# Patient Record
Sex: Male | Born: 1940 | Race: White | Hispanic: No | Marital: Married | Smoking: Former smoker
Health system: Southern US, Community
[De-identification: ages and names within clinical notes are randomized; demographics above are authoritative.]

## PROBLEM LIST (undated history)

## (undated) DIAGNOSIS — Z789 Other specified health status: Secondary | ICD-10-CM

## (undated) DIAGNOSIS — G5603 Carpal tunnel syndrome, bilateral upper limbs: Secondary | ICD-10-CM

## (undated) DIAGNOSIS — M8580 Other specified disorders of bone density and structure, unspecified site: Secondary | ICD-10-CM

## (undated) DIAGNOSIS — M4800 Spinal stenosis, site unspecified: Secondary | ICD-10-CM

## (undated) DIAGNOSIS — R634 Abnormal weight loss: Secondary | ICD-10-CM

## (undated) DIAGNOSIS — S42309A Unspecified fracture of shaft of humerus, unspecified arm, initial encounter for closed fracture: Secondary | ICD-10-CM

## (undated) DIAGNOSIS — B009 Herpesviral infection, unspecified: Secondary | ICD-10-CM

## (undated) DIAGNOSIS — R350 Frequency of micturition: Secondary | ICD-10-CM

## (undated) DIAGNOSIS — F40240 Claustrophobia: Secondary | ICD-10-CM

## (undated) DIAGNOSIS — N400 Enlarged prostate without lower urinary tract symptoms: Secondary | ICD-10-CM

## (undated) DIAGNOSIS — G5760 Lesion of plantar nerve, unspecified lower limb: Secondary | ICD-10-CM

## (undated) DIAGNOSIS — N3281 Overactive bladder: Secondary | ICD-10-CM

## (undated) DIAGNOSIS — R1032 Left lower quadrant pain: Secondary | ICD-10-CM

## (undated) DIAGNOSIS — E785 Hyperlipidemia, unspecified: Secondary | ICD-10-CM

## (undated) DIAGNOSIS — Z973 Presence of spectacles and contact lenses: Secondary | ICD-10-CM

## (undated) HISTORY — PX: CARPAL TUNNEL RELEASE: SHX101

## (undated) HISTORY — DX: Spinal stenosis, site unspecified: M48.00

## (undated) HISTORY — DX: Unspecified fracture of shaft of humerus, unspecified arm, initial encounter for closed fracture: S42.309A

## (undated) HISTORY — PX: TONSILLECTOMY: SUR1361

## (undated) HISTORY — DX: Left lower quadrant pain: R10.32

## (undated) HISTORY — DX: Carpal tunnel syndrome, bilateral upper limbs: G56.03

## (undated) HISTORY — DX: Other specified disorders of bone density and structure, unspecified site: M85.80

## (undated) HISTORY — DX: Overactive bladder: N32.81

## (undated) HISTORY — DX: Lesion of plantar nerve, unspecified lower limb: G57.60

## (undated) HISTORY — DX: Abnormal weight loss: R63.4

## (undated) HISTORY — DX: Herpesviral infection, unspecified: B00.9

## (undated) HISTORY — PX: COLONOSCOPY: SHX174

## (undated) HISTORY — PX: LUMBAR EPIDURAL INJECTION: SHX1980

## (undated) HISTORY — PX: HERNIA REPAIR: SHX51

## (undated) HISTORY — DX: Claustrophobia: F40.240

## (undated) HISTORY — PX: FOOT NEUROMA SURGERY: SHX646

---

## 2002-01-01 ENCOUNTER — Encounter: Admission: RE | Admit: 2002-01-01 | Discharge: 2002-01-01 | Payer: Self-pay | Admitting: Gastroenterology

## 2002-01-01 ENCOUNTER — Encounter: Payer: Self-pay | Admitting: Gastroenterology

## 2002-04-17 ENCOUNTER — Encounter: Admission: RE | Admit: 2002-04-17 | Discharge: 2002-04-17 | Payer: Self-pay | Admitting: Orthopaedic Surgery

## 2002-04-17 ENCOUNTER — Encounter: Payer: Self-pay | Admitting: Orthopaedic Surgery

## 2002-05-10 ENCOUNTER — Encounter: Payer: Self-pay | Admitting: Orthopaedic Surgery

## 2002-05-10 ENCOUNTER — Encounter: Admission: RE | Admit: 2002-05-10 | Discharge: 2002-05-10 | Payer: Self-pay | Admitting: Orthopaedic Surgery

## 2002-05-25 ENCOUNTER — Encounter: Admission: RE | Admit: 2002-05-25 | Discharge: 2002-05-25 | Payer: Self-pay | Admitting: Orthopaedic Surgery

## 2002-05-25 ENCOUNTER — Encounter: Payer: Self-pay | Admitting: Orthopaedic Surgery

## 2002-06-09 ENCOUNTER — Encounter: Admission: RE | Admit: 2002-06-09 | Discharge: 2002-06-09 | Payer: Self-pay | Admitting: Orthopaedic Surgery

## 2002-06-09 ENCOUNTER — Encounter: Payer: Self-pay | Admitting: Orthopaedic Surgery

## 2004-03-17 ENCOUNTER — Encounter: Admission: RE | Admit: 2004-03-17 | Discharge: 2004-03-17 | Payer: Self-pay | Admitting: Orthopaedic Surgery

## 2004-04-24 ENCOUNTER — Encounter: Admission: RE | Admit: 2004-04-24 | Discharge: 2004-04-24 | Payer: Self-pay | Admitting: Orthopaedic Surgery

## 2004-05-14 ENCOUNTER — Encounter: Admission: RE | Admit: 2004-05-14 | Discharge: 2004-05-14 | Payer: Self-pay | Admitting: Orthopaedic Surgery

## 2007-07-13 ENCOUNTER — Encounter: Admission: RE | Admit: 2007-07-13 | Discharge: 2007-07-13 | Payer: Self-pay | Admitting: Orthopaedic Surgery

## 2008-08-30 ENCOUNTER — Encounter: Admission: RE | Admit: 2008-08-30 | Discharge: 2008-08-30 | Payer: Self-pay | Admitting: Orthopedic Surgery

## 2008-12-02 ENCOUNTER — Encounter: Admission: RE | Admit: 2008-12-02 | Discharge: 2008-12-02 | Payer: Self-pay | Admitting: Orthopaedic Surgery

## 2010-03-30 ENCOUNTER — Ambulatory Visit (HOSPITAL_COMMUNITY)
Admission: RE | Admit: 2010-03-30 | Discharge: 2010-03-30 | Payer: Self-pay | Source: Home / Self Care | Attending: Internal Medicine | Admitting: Internal Medicine

## 2010-10-04 ENCOUNTER — Other Ambulatory Visit: Payer: Self-pay | Admitting: Internal Medicine

## 2010-10-04 DIAGNOSIS — J984 Other disorders of lung: Secondary | ICD-10-CM

## 2010-10-09 ENCOUNTER — Ambulatory Visit
Admission: RE | Admit: 2010-10-09 | Discharge: 2010-10-09 | Disposition: A | Discharge status: Home / Self Care | Payer: Medicare Other | Source: Ambulatory Visit | Attending: Internal Medicine | Admitting: Internal Medicine

## 2010-10-09 DIAGNOSIS — J984 Other disorders of lung: Secondary | ICD-10-CM

## 2010-10-09 MED ORDER — IOHEXOL 300 MG/ML  SOLN
75.0000 mL | Freq: Once | INTRAMUSCULAR | Status: AC | PRN
  Administered 2010-10-09: 75 mL via INTRAVENOUS

## 2011-04-02 ENCOUNTER — Other Ambulatory Visit: Payer: Self-pay | Admitting: Internal Medicine

## 2011-04-02 ENCOUNTER — Other Ambulatory Visit: Payer: Self-pay | Admitting: *Deleted

## 2011-04-02 DIAGNOSIS — R911 Solitary pulmonary nodule: Secondary | ICD-10-CM

## 2011-04-05 ENCOUNTER — Ambulatory Visit
Admission: RE | Admit: 2011-04-05 | Discharge: 2011-04-05 | Disposition: A | Discharge status: Home / Self Care | Payer: Medicare Other | Source: Ambulatory Visit | Attending: Internal Medicine | Admitting: Internal Medicine

## 2011-04-05 DIAGNOSIS — R911 Solitary pulmonary nodule: Secondary | ICD-10-CM

## 2011-04-05 MED ORDER — IOHEXOL 300 MG/ML  SOLN
75.0000 mL | Freq: Once | INTRAMUSCULAR | Status: AC | PRN
  Administered 2011-04-05: 75 mL via INTRAVENOUS

## 2011-07-11 ENCOUNTER — Other Ambulatory Visit: Payer: Self-pay | Admitting: Orthopedic Surgery

## 2011-07-17 ENCOUNTER — Encounter (HOSPITAL_BASED_OUTPATIENT_CLINIC_OR_DEPARTMENT_OTHER): Payer: Self-pay | Admitting: *Deleted

## 2011-07-17 NOTE — Progress Notes (Signed)
No labs needed Told him wedding ring would have to be cut off if could not get off.-lt ctr

## 2011-07-23 ENCOUNTER — Encounter (HOSPITAL_BASED_OUTPATIENT_CLINIC_OR_DEPARTMENT_OTHER): Payer: Self-pay

## 2011-07-23 ENCOUNTER — Encounter (HOSPITAL_BASED_OUTPATIENT_CLINIC_OR_DEPARTMENT_OTHER): Payer: Self-pay | Admitting: Certified Registered Nurse Anesthetist

## 2011-07-23 ENCOUNTER — Encounter (HOSPITAL_BASED_OUTPATIENT_CLINIC_OR_DEPARTMENT_OTHER): Payer: Self-pay | Admitting: Orthopedic Surgery

## 2011-07-23 ENCOUNTER — Ambulatory Visit (HOSPITAL_BASED_OUTPATIENT_CLINIC_OR_DEPARTMENT_OTHER): Payer: Medicare Other | Admitting: Certified Registered Nurse Anesthetist

## 2011-07-23 ENCOUNTER — Ambulatory Visit (HOSPITAL_BASED_OUTPATIENT_CLINIC_OR_DEPARTMENT_OTHER)
Admission: RE | Admit: 2011-07-23 | Discharge: 2011-07-23 | Disposition: A | Discharge status: Home / Self Care | Payer: Medicare Other | Source: Ambulatory Visit | Attending: Orthopedic Surgery | Admitting: Orthopedic Surgery

## 2011-07-23 ENCOUNTER — Encounter (HOSPITAL_BASED_OUTPATIENT_CLINIC_OR_DEPARTMENT_OTHER)
Admission: RE | Disposition: A | Discharge status: Home / Self Care | Payer: Self-pay | Source: Ambulatory Visit | Attending: Orthopedic Surgery

## 2011-07-23 DIAGNOSIS — G5600 Carpal tunnel syndrome, unspecified upper limb: Secondary | ICD-10-CM | POA: Insufficient documentation

## 2011-07-23 HISTORY — DX: Benign prostatic hyperplasia without lower urinary tract symptoms: N40.0

## 2011-07-23 HISTORY — DX: Other specified health status: Z78.9

## 2011-07-23 HISTORY — DX: Frequency of micturition: R35.0

## 2011-07-23 HISTORY — DX: Hyperlipidemia, unspecified: E78.5

## 2011-07-23 SURGERY — CARPAL TUNNEL RELEASE
Anesthesia: Monitor Anesthesia Care | Site: Hand | Laterality: Left | Wound class: Clean

## 2011-07-23 MED ORDER — PROPOFOL 10 MG/ML IV EMUL
INTRAVENOUS | Status: DC | PRN
  Administered 2011-07-23: 75 ug/kg/min via INTRAVENOUS

## 2011-07-23 MED ORDER — LIDOCAINE HCL (PF) 0.5 % IJ SOLN
INTRAMUSCULAR | Status: DC | PRN
  Administered 2011-07-23: 30 mL via INTRATHECAL

## 2011-07-23 MED ORDER — CEFAZOLIN SODIUM 1-5 GM-% IV SOLN
1.0000 g | INTRAVENOUS | Status: AC
  Administered 2011-07-23: 2 g via INTRAVENOUS

## 2011-07-23 MED ORDER — CHLORHEXIDINE GLUCONATE 4 % EX LIQD: 60.0000 mL | Freq: Once | CUTANEOUS | Status: DC

## 2011-07-23 MED ORDER — FENTANYL CITRATE 0.05 MG/ML IJ SOLN: 25.0000 ug | INTRAMUSCULAR | Status: DC | PRN

## 2011-07-23 MED ORDER — MIDAZOLAM HCL 2 MG/2ML IJ SOLN: 0.5000 mg | INTRAMUSCULAR | Status: DC | PRN

## 2011-07-23 MED ORDER — FENTANYL CITRATE 0.05 MG/ML IJ SOLN
INTRAMUSCULAR | Status: DC | PRN
  Administered 2011-07-23: 50 ug via INTRAVENOUS

## 2011-07-23 MED ORDER — MORPHINE SULFATE 2 MG/ML IJ SOLN: 0.0500 mg/kg | INTRAMUSCULAR | Status: DC | PRN

## 2011-07-23 MED ORDER — FENTANYL CITRATE 0.05 MG/ML IJ SOLN: 50.0000 ug | INTRAMUSCULAR | Status: DC | PRN

## 2011-07-23 MED ORDER — DEXAMETHASONE SODIUM PHOSPHATE 10 MG/ML IJ SOLN
INTRAMUSCULAR | Status: DC | PRN
  Administered 2011-07-23: 4 mg via INTRAVENOUS

## 2011-07-23 MED ORDER — LACTATED RINGERS IV SOLN
INTRAVENOUS | Status: DC
  Administered 2011-07-23 (×3): via INTRAVENOUS

## 2011-07-23 MED ORDER — LIDOCAINE HCL (CARDIAC) 20 MG/ML IV SOLN
INTRAVENOUS | Status: DC | PRN
  Administered 2011-07-23: 20 mg via INTRAVENOUS

## 2011-07-23 MED ORDER — ONDANSETRON HCL 4 MG/2ML IJ SOLN
INTRAMUSCULAR | Status: DC | PRN
  Administered 2011-07-23: 4 mg via INTRAVENOUS

## 2011-07-23 MED ORDER — BUPIVACAINE HCL (PF) 0.25 % IJ SOLN
INTRAMUSCULAR | Status: DC | PRN
  Administered 2011-07-23: 7 mL

## 2011-07-23 MED ORDER — METOCLOPRAMIDE HCL 5 MG/ML IJ SOLN: 10.0000 mg | Freq: Once | INTRAMUSCULAR | Status: DC | PRN

## 2011-07-23 SURGICAL SUPPLY — 35 items
BANDAGE GAUZE ELAST BULKY 4 IN (GAUZE/BANDAGES/DRESSINGS) ×2 IMPLANT
BLADE SURG 15 STRL LF DISP TIS (BLADE) ×1 IMPLANT
BLADE SURG 15 STRL SS (BLADE) ×2
BNDG CMPR 9X4 STRL LF SNTH (GAUZE/BANDAGES/DRESSINGS)
BNDG COHESIVE 3X5 TAN STRL LF (GAUZE/BANDAGES/DRESSINGS) ×2 IMPLANT
BNDG ESMARK 4X9 LF (GAUZE/BANDAGES/DRESSINGS) IMPLANT
CHLORAPREP W/TINT 26ML (MISCELLANEOUS) ×2 IMPLANT
CLOTH BEACON ORANGE TIMEOUT ST (SAFETY) ×1 IMPLANT
CORDS BIPOLAR (ELECTRODE) ×2 IMPLANT
COVER MAYO STAND STRL (DRAPES) ×2 IMPLANT
COVER TABLE BACK 60X90 (DRAPES) ×2 IMPLANT
CUFF TOURNIQUET SINGLE 18IN (TOURNIQUET CUFF) ×2 IMPLANT
DRAPE EXTREMITY T 121X128X90 (DRAPE) ×2 IMPLANT
DRAPE SURG 17X23 STRL (DRAPES) ×2 IMPLANT
DRSG KUZMA FLUFF (GAUZE/BANDAGES/DRESSINGS) ×2 IMPLANT
GAUZE XEROFORM 1X8 LF (GAUZE/BANDAGES/DRESSINGS) ×2 IMPLANT
GLOVE BIO SURGEON STRL SZ 6.5 (GLOVE) ×2 IMPLANT
GLOVE SURG ORTHO 8.0 STRL STRW (GLOVE) ×2 IMPLANT
GOWN BRE IMP PREV XXLGXLNG (GOWN DISPOSABLE) ×2 IMPLANT
GOWN PREVENTION PLUS XLARGE (GOWN DISPOSABLE) ×2 IMPLANT
NEEDLE 27GAX1X1/2 (NEEDLE) ×1 IMPLANT
NS IRRIG 1000ML POUR BTL (IV SOLUTION) ×2 IMPLANT
PACK BASIN DAY SURGERY FS (CUSTOM PROCEDURE TRAY) ×2 IMPLANT
PAD CAST 3X4 CTTN HI CHSV (CAST SUPPLIES) ×1 IMPLANT
PADDING CAST ABS 4INX4YD NS (CAST SUPPLIES)
PADDING CAST ABS COTTON 4X4 ST (CAST SUPPLIES) ×1 IMPLANT
PADDING CAST COTTON 3X4 STRL (CAST SUPPLIES) ×2
SPONGE GAUZE 4X4 12PLY (GAUZE/BANDAGES/DRESSINGS) ×2 IMPLANT
STOCKINETTE 4X48 STRL (DRAPES) ×2 IMPLANT
SUT VICRYL 4-0 PS2 18IN ABS (SUTURE) IMPLANT
SUT VICRYL RAPIDE 4/0 PS 2 (SUTURE) ×2 IMPLANT
SYR BULB 3OZ (MISCELLANEOUS) ×2 IMPLANT
SYR CONTROL 10ML LL (SYRINGE) ×1 IMPLANT
TOWEL OR 17X24 6PK STRL BLUE (TOWEL DISPOSABLE) ×2 IMPLANT
UNDERPAD 30X30 INCONTINENT (UNDERPADS AND DIAPERS) ×2 IMPLANT

## 2011-07-23 NOTE — Progress Notes (Signed)
Dr. Cipriano Mile in to see.

## 2011-07-23 NOTE — Op Note (Signed)
NAME:  OLGA, SEYLER NO.:  [id number]  MEDICAL RECORD NO.:  [id number]  LOCATION:                                 FACILITY:  PHYSICIAN:  Cindee Salt, M.D.            DATE OF BIRTH:  DATE OF PROCEDURE:  07/23/2011 DATE OF DISCHARGE:                              OPERATIVE REPORT   PREOPERATIVE DIAGNOSIS:  Carpal tunnel syndrome, left hand.  POSTOPERATIVE DIAGNOSIS:  Carpal tunnel syndrome, left hand.  OPERATION:  Decompression of median nerve.  SURGEON:  Cindee Salt, M.D.  ASSISTANT:  None.  ANESTHESIA:  Forearm-based IV regional with local infiltration.  ANESTHESIOLOGIST:  Janetta Hora. Gelene Mink, M.D.  HISTORY:  The patient is a 71 year old male with a history of carpal tunnel syndrome.  Nerve conductions positive, not responsive to conservative treatment.  He has elected to undergo surgical decompression.  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  Pre, peri, and postoperative course have been discussed along with risks and complications.  He is aware that there is no guarantee with the surgery, possibility of infection, recurrence injury to arteries, nerves, tendons, incomplete relief of symptoms, and dystrophy.  In the preoperative area, the patient is seen, the extremity marked by both the patient and surgeon, and antibiotic given.  PROCEDURE:  The patient was brought to the operating room where a forearm-based IV regional anesthetic was carried out without difficulty. He was prepped using ChloraPrep, supine position, right arm free.  A 3- minute dry time was allowed.  Time-out taken, confirming the patient and procedure.  A longitudinal incision was made in the palm, carried down through subcutaneous tissue.  Bleeders were electrocauterized with bipolar.  Palmar fascia was split.  Superficial palmar arch identified. The flexor tendon of the ring little finger identified,  retractors placed.  To the ulnar side of median nerve, carpal retinaculum was incised with sharp dissection.  Right angle and Sewall retractor were placed between skin and forearm fascia.  The fascia released for approximately a 1.5 cm proximal to the wrist crease under direct vision. Canal was explored.  Air compression to the nerve was apparent.  No further lesions were identified.  The wound was irrigated.  Skin was closed with interrupted 4-0 Vicryl Rapide sutures. Local infiltration with 0.25% Marcaine without epinephrine was given, approximately 8 mL was used.  A sterile compressive dressing with fingers free was applied.  On deflation of the tourniquet, all fingers immediately pinked.  He was taken to the recovery room for observation in satisfactory condition.          ______________________________ Cindee Salt, M.D.     GK/MEDQ  D:  07/23/2011  T:  07/23/2011  Job:  [id number]

## 2011-07-23 NOTE — H&P (Signed)
William Swanson is a 71 year-old right-hand dominant male complaining of numbness and tingling, left hand or his forearm especially in the evening.  This has been going on for approximately four weeks.  He has occasional symptoms on his right side.  He states that driving will frequently cause a problem for him.  He awakes and shakes his hand. This will frequently help settle symptoms down for him.  He was placed in a wrist splint which made matters worse for him.  He has no history of injury to the hand or to the neck.  No history of diabetes, thyroid problems, arthritis or gout.  He has been taking Aleve two twice a day with meals and precautions giving him minimal relief.  He complains of an intermittent, dull aching type pain with the numbness all fingers.  This has not significantly changed.  NCV are positive for CTS.  ALLERGIES:   None. MEDICATIONS:   Istalol, Lipitor and Uroxatral. SURGICAL HISTORY:   Tonsillectomy FAMILY MEDICAL HISTORY:   Negative. SOCIAL HISTORY:    He does not smoke, he drinks socially.  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 REVIEW OF SYSTEMS:    Positive for glasses, otherwise negative 14 points.  William Swanson is an 71 y.o. male.   Chief Complaint: CTS lt HPI: see above  Past Medical History  Diagnosis Date  . BPH (benign prostatic hyperplasia)     mild  . Frequency of urination   . Hyperlipemia   . No pertinent past medical history     Past Surgical History  Procedure Date  . Tonsillectomy   . Colonoscopy   . Foot neuroma surgery     lt foot  . Lumbar epidural injection     History reviewed. No pertinent family history. Social History:  reports that he quit smoking about 36 years ago. He does not have any smokeless tobacco history on file. He reports that he drinks alcohol. He reports that he does not use illicit drugs.  Allergies: No Known Allergies  No current facility-administered medications on file as of .   Medications Prior to Admission    Medication Sig Dispense Refill  . alfuzosin (UROXATRAL) 10 MG 24 hr tablet Take 10 mg by mouth daily.      Marland Kitchen aspirin 81 MG tablet Take 81 mg by mouth daily.      Marland Kitchen atorvastatin (LIPITOR) 20 MG tablet Take 20 mg by mouth daily.      . calcium citrate (CALCITRATE - DOSED IN MG ELEMENTAL CALCIUM) 950 MG tablet Take 1 tablet by mouth daily.      . cholecalciferol (VITAMIN D) 1000 UNITS tablet Take 1,000 Units by mouth daily.      . fish oil-omega-3 fatty acids 1000 MG capsule Take 2 g by mouth daily.      . vitamin C (ASCORBIC ACID) 500 MG tablet Take 500 mg by mouth daily.        No results found for this or any previous visit (from the past 48 hour(s)).  No results found.   Pertinent items are noted in HPI.  Height 5\' 9"  (1.753 m), weight 77.111 kg (170 lb).  General appearance: alert, cooperative and appears stated age Head: Normocephalic, without obvious abnormality Neck: no adenopathy Resp: clear to auscultation bilaterally Cardio: regular rate and rhythm, S1, S2 normal, no murmur, click, rub or gallop GI: soft, non-tender; bowel sounds normal; no masses,  no organomegaly Extremities: extremities normal, atraumatic, no cyanosis or edema  Pulses: 2+ and symmetric Skin: Skin color, texture, turgor normal. No rashes or lesions Neurologic: Grossly normal Incision/Wound: na  Assessment/Plan He would like to have the carpal tunnels treated. He states he has been nursing this for a year and it has not disappeared. The pre, peri and post op course are discussed along with risks and complications. He is aware there is no guarantee with surgery, possibility of infection, recurrence, injury to arteries, nerves and tendons, incomplete relief of symptoms and dystrophy.   William Swanson R 07/23/2011, 5:38 AM

## 2011-07-23 NOTE — Discharge Instructions (Addendum)

## 2011-07-23 NOTE — Progress Notes (Signed)
Pulse 38-45. bp stable.  Denies s&s.  Dr. Gelene Mink in formed.  No new orders rexceived.  Will check vs in phase 2.

## 2011-07-23 NOTE — Anesthesia Procedure Notes (Signed)
Procedure Name: MAC Date/Time: 07/23/2011 8:41 AM Performed by: Mayla Biddy D Pre-anesthesia Checklist: Patient identified, Emergency Drugs available, Suction available, Patient being monitored and Timeout performed Patient Re-evaluated:Patient Re-evaluated prior to inductionOxygen Delivery Method: Simple face mask

## 2011-07-23 NOTE — Transfer of Care (Signed)
Immediate Anesthesia Transfer of Care Note  Patient: William Swanson  Procedure(s) Performed: Procedure(s) (LRB): CARPAL TUNNEL RELEASE (Left)  Patient Location: PACU  Anesthesia Type: Bier block  Level of Consciousness: awake, alert , oriented and patient cooperative  Airway & Oxygen Therapy: Patient Spontanous Breathing and Patient connected to face mask oxygen  Post-op Assessment: Report given to PACU RN and Post -op Vital signs reviewed and stable  Post vital signs: Reviewed and stable  Complications: No apparent anesthesia complications

## 2011-07-23 NOTE — Anesthesia Postprocedure Evaluation (Signed)
Anesthesia Post Note  Patient: William Swanson  Procedure(s) Performed: Procedure(s) (LRB): CARPAL TUNNEL RELEASE (Left)  Anesthesia type: MAC  Patient location: PACU  Post pain: Pain level controlled  Post assessment: Patient's Cardiovascular Status Stable  Last Vitals:  Filed Vitals:   07/23/11 0945  BP: 103/64  Pulse:   Temp:   Resp:     Post vital signs: Reviewed and stable  Level of consciousness: alert  Complications: No apparent anesthesia complications

## 2011-07-23 NOTE — Op Note (Signed)
Dictated number: [id number]

## 2011-07-23 NOTE — Brief Op Note (Signed)
07/23/2011  9:12 AM  PATIENT:  William Swanson  71 y.o. male  PRE-OPERATIVE DIAGNOSIS:  left carpal tunnel syndrome  POST-OPERATIVE DIAGNOSIS:  left carpal tunnel syndrome  PROCEDURE:  Procedure(s) (LRB): CARPAL TUNNEL RELEASE (Left)  SURGEON:  Surgeon(s) and Role:    * Nicki Reaper, MD - Primary  PHYSICIAN ASSISTANT:   ASSISTANTS: none   ANESTHESIA:   local and regional  EBL:  Total I/O In: 800 [I.V.:800] Out: -   BLOOD ADMINISTERED:none  DRAINS: none   LOCAL MEDICATIONS USED:  MARCAINE     SPECIMEN:  No Specimen  DISPOSITION OF SPECIMEN:  N/A  COUNTS:  YES  TOURNIQUET:   Total Tourniquet Time Documented: Forearm (Left) - 23 minutes  DICTATION: .Other Dictation: Dictation Number [id number]  PLAN OF CARE: Discharge to home after PACU  PATIENT DISPOSITION:  PACU - hemodynamically stable.

## 2011-07-23 NOTE — Anesthesia Preprocedure Evaluation (Signed)
Anesthesia Evaluation  Patient identified by MRN, date of birth, ID band Patient awake    Reviewed: Allergy & Precautions, H&P , NPO status , Patient's Chart, lab work & pertinent test results, reviewed documented beta blocker date and time   Airway Mallampati: II TM Distance: >3 FB Neck ROM: full    Dental   Pulmonary neg pulmonary ROS,          Cardiovascular negative cardio ROS      Neuro/Psych negative neurological ROS  negative psych ROS   GI/Hepatic negative GI ROS, Neg liver ROS,   Endo/Other  negative endocrine ROS  Renal/GU negative Renal ROS  negative genitourinary   Musculoskeletal   Abdominal   Peds  Hematology negative hematology ROS (+)   Anesthesia Other Findings See surgeon's H&P   Reproductive/Obstetrics negative OB ROS                           Anesthesia Physical Anesthesia Plan  ASA: II  Anesthesia Plan: MAC and Bier Block   Post-op Pain Management:    Induction: Intravenous  Airway Management Planned: Natural Airway  Additional Equipment:   Intra-op Plan:   Post-operative Plan: Extubation in OR  Informed Consent: I have reviewed the patients History and Physical, chart, labs and discussed the procedure including the risks, benefits and alternatives for the proposed anesthesia with the patient or authorized representative who has indicated his/her understanding and acceptance.     Plan Discussed with: CRNA and Surgeon  Anesthesia Plan Comments:         Anesthesia Quick Evaluation

## 2011-07-25 ENCOUNTER — Encounter (HOSPITAL_BASED_OUTPATIENT_CLINIC_OR_DEPARTMENT_OTHER): Payer: Self-pay | Admitting: Orthopedic Surgery

## 2011-08-22 ENCOUNTER — Encounter: Payer: Self-pay | Admitting: Cardiology

## 2011-08-22 ENCOUNTER — Ambulatory Visit (INDEPENDENT_AMBULATORY_CARE_PROVIDER_SITE_OTHER): Payer: Medicare Other | Admitting: Cardiology

## 2011-08-22 VITALS — BP 130/76 | HR 87 | Resp 18 | Ht 69.0 in | Wt 182.8 lb

## 2011-08-22 DIAGNOSIS — E7800 Pure hypercholesterolemia, unspecified: Secondary | ICD-10-CM

## 2011-08-22 DIAGNOSIS — R42 Dizziness and giddiness: Secondary | ICD-10-CM

## 2011-08-22 DIAGNOSIS — R9431 Abnormal electrocardiogram [ECG] [EKG]: Secondary | ICD-10-CM

## 2011-08-22 NOTE — Patient Instructions (Signed)
Your physician has requested that you have an echocardiogram in 1 WEEK. Echocardiography is a painless test that uses sound waves to create images of your heart. It provides your doctor with information about the size and shape of your heart and how well your heart's chambers and valves are working. This procedure takes approximately one hour. There are no restrictions for this procedure.  Your physician has requested that you have an exercise tolerance test in 1 WEEK. For further information please visit [email or web address] Please also follow instruction sheet, as given.  Your physician recommends that you continue on your current medications as directed. Please refer to the Current Medication list given to you today.

## 2011-08-23 ENCOUNTER — Encounter: Payer: Self-pay | Admitting: Cardiology

## 2011-08-23 DIAGNOSIS — R9431 Abnormal electrocardiogram [ECG] [EKG]: Secondary | ICD-10-CM | POA: Insufficient documentation

## 2011-08-23 DIAGNOSIS — R42 Dizziness and giddiness: Secondary | ICD-10-CM | POA: Insufficient documentation

## 2011-08-23 DIAGNOSIS — E7800 Pure hypercholesterolemia, unspecified: Secondary | ICD-10-CM | POA: Insufficient documentation

## 2011-08-23 NOTE — Progress Notes (Signed)
HPI:  Mr.  William Swanson is seen at the courtesy of Dr. Eric Form.  I have known the patient for many years through a variety of connections.  We are asked to see him because of a persisting issue with intermittent lightheadedness.  I have some records from Dr. Clelia Croft, and have spoken with him about the patient.  Mr. Swanson underwent carpal tunnel surgery on April 2, and leading into that was fine.  Since then, he has had a lingering lightheadedness that is intermittent.  It is not necessarily positional and can occur at any time.  His initial visit at [facility] was with their nurse practitioner, and an orthostatic component was suggested as he has had some mild chronic positional lightheadedness.  He also had moderate resting bradycardia with a pulse in the forties.   However, this has persisted.  Dr. Clelia Croft appropriately suggested some time out from the surgery, but it has not necessarily gotten better with some time now.  As such, consideration for evaluation was suggested.  It can occur any time.  For example, he was great Monday and Tuesday of this week, but today had some of the same, and experiences some of it presently.  During this period, both pulse, and orthostatic BP are ok.  At the same time, he exercises vigorously per his usual routine, and has noted nothing, and actually feels better.  He works out 4-5 times per week.  He also notes some acid reflux---burning after meals.  He does have some mild, poorly described chest tightness, but it is not with exertion.    Current Outpatient Prescriptions  Medication Sig Dispense Refill  . aspirin 81 MG tablet Take 81 mg by mouth daily.      Marland Kitchen atorvastatin (LIPITOR) 20 MG tablet Take 20 mg by mouth daily.      . calcium citrate (CALCITRATE - DOSED IN MG ELEMENTAL CALCIUM) 950 MG tablet Take 1 tablet by mouth daily.      . cholecalciferol (VITAMIN D) 1000 UNITS tablet Take 1,000 Units by mouth daily.      . fish oil-omega-3 fatty acids 1000 MG capsule  Take 2 g by mouth daily.      . hydrocortisone butyrate (LUCOID) 0.1 % CREA cream       . INTERMEZZO 3.5 MG SUBL as directed.       . vitamin C (ASCORBIC ACID) 500 MG tablet Take 500 mg by mouth daily.      Marland Kitchen alfuzosin (UROXATRAL) 10 MG 24 hr tablet Take 10 mg by mouth daily.        No Known Allergies  Past Medical History  Diagnosis Date  . BPH (benign prostatic hyperplasia)     mild  . Frequency of urination   . Hyperlipemia   . No pertinent past medical history     Past Surgical History  Procedure Date  . Tonsillectomy   . Colonoscopy   . Foot neuroma surgery     lt foot  . Lumbar epidural injection   . Carpal tunnel release 07/23/2011    Procedure: CARPAL TUNNEL RELEASE;  Surgeon: Nicki Reaper, MD;  Location: [facility];  Service: Orthopedics;  Laterality: Left;    No family history on file.  History   Social History  . Marital Status: Married    Spouse Name: N/A    Number of Children: N/A  . Years of Education: N/A   Occupational History  . Not on file.   Social History  Main Topics  . Smoking status: Former Smoker    Quit date: 07/17/1975  . Smokeless tobacco: Not on file  . Alcohol Use: Yes     daily wine  . Drug Use: No  . Sexually Active:    Other Topics Concern  . Not on file   Social History Narrative  . No narrative on file    ROS: Please see the HPI.   He denies cough, exertional chest pressure, GI complaints, vertigo, seizures, loss of consciousness.   All other systems reviewed and negative.  PHYSICAL EXAM:  BP 130/76  Pulse 87  Resp 18  Ht 5\' 9"  (1.753 m)  Wt 182 lb 12.8 oz (82.918 kg)  BMI 26.99 kg/m2  SpO2 98%  BP 120 R and L.  BP with standing  110/70  General: Well developed, well nourished, in no acute distress. Head:  Normocephalic and atraumatic. Neck: no JVD Lungs: Clear to auscultation and percussion. Heart: Normal S1 and S2.  No murmur, rubs or gallops.  Abdomen:  Normal bowel sounds; soft; non tender;  no organomegaly Pulses: Pulses normal in all 4 extremities. Extremities: No clubbing or cyanosis. No edema. Neurologic: Alert and oriented x 3.  Normal Finger to nose.  No drift.  Strength equal bilater.  CN intact.    EKG: NSR.  Possible LAE.  LAFB.  Incomplete RBBB.  Abnormal ECG.    ASSESSMENT AND PLAN:

## 2011-08-23 NOTE — Assessment & Plan Note (Signed)
Managed by Dr.Shaw.

## 2011-08-23 NOTE — Assessment & Plan Note (Addendum)
The mechanism of this is unclear.  Notably, he has a normal BP and Pulse,and has the symptoms today sitting here.  There is not a significant orthostatic component, and he overall looks good.  I taught him to take his pulse today.  I discussed with Dr. Clelia Croft.  His neuro exam is non focal.  However, MRI might be helpful to exclude a silent infarct.  Dr. Clelia Croft will arrange.  I doubt a cardiac component to this for obvious reasons.  Not related to bradycardia as the heart rate today is 73.

## 2011-08-23 NOTE — Assessment & Plan Note (Signed)
He does have an abnormal ECG.  Will get a 2D echo to assess.

## 2011-08-27 ENCOUNTER — Ambulatory Visit (HOSPITAL_COMMUNITY): Payer: Medicare Other | Attending: Cardiology

## 2011-08-27 ENCOUNTER — Other Ambulatory Visit: Payer: Self-pay

## 2011-08-27 DIAGNOSIS — R9431 Abnormal electrocardiogram [ECG] [EKG]: Secondary | ICD-10-CM | POA: Insufficient documentation

## 2011-08-27 DIAGNOSIS — R42 Dizziness and giddiness: Secondary | ICD-10-CM | POA: Insufficient documentation

## 2011-08-28 ENCOUNTER — Ambulatory Visit (INDEPENDENT_AMBULATORY_CARE_PROVIDER_SITE_OTHER): Payer: Medicare Other | Admitting: Cardiology

## 2011-08-28 ENCOUNTER — Encounter: Payer: Self-pay | Admitting: Cardiology

## 2011-08-28 DIAGNOSIS — R9431 Abnormal electrocardiogram [ECG] [EKG]: Secondary | ICD-10-CM

## 2011-08-28 NOTE — Procedures (Signed)
Exercise Treadmill Test  Pre-Exercise Testing Evaluation Rhythm: normal sinus  Rate: 63   PR:  .18 QRS:  .09  QT:  .42 QTc: .43     Test  Exercise Tolerance Test Ordering MD: Shawnie Pons, MD  Interpreting MD: Shawnie Pons, MD  Unique Test No: 1  Treadmill:  1  Indication for ETT: Abnormal EKG  Contraindication to ETT: No   Stress Modality: exercise - treadmill  Cardiac Imaging Performed: non   Protocol: standard Bruce - maximal  Max BP: 183/79  Max MPHR (bpm):  148 85% MPR (bpm):  125  MPHR obtained (bpm):  126 % MPHR obtained:  86%  Reached 85% MPHR (min:sec):  10:40 Total Exercise Time (min-sec):  11:02  Workload in METS:  13.1 Borg Scale: 15  Reason ETT Terminated:  fatigue    ST Segment Analysis At Rest: normal ST segments - no evidence of significant ST depression With Exercise: no evidence of significant ST depression  Other Information Arrhythmia:  No Angina during ETT:  absent (0) Quality of ETT:  diagnostic  ETT Interpretation:  normal - no evidence of ischemia by ST analysis  Comments: The patient exercised on the Bruce protocol for 11 minutes.  He experienced no chest pain.  The test was terminated to fatigue, although we could have pushed him farther.  Conditioning and exercise tolerance were excellent.  There was a rare PVC and normal ST segment response.  The study was felt to be negative for inducible ischemia.  He had the lightheaded was present before the study, and better after exercise.  BP response was normal.   Recommendations: Standard GXT demonstrating normal BP response, no angina, no ST segment changes, and negative for inducible symptoms at a good work load.  Follow up with Dr. Clelia Croft and consideration of MRI and/or neuro evaluation would be considered.

## 2011-10-09 ENCOUNTER — Encounter: Payer: Self-pay | Admitting: Cardiology

## 2011-10-14 ENCOUNTER — Encounter: Payer: Self-pay | Admitting: Cardiology

## 2011-11-11 ENCOUNTER — Encounter: Payer: Self-pay | Admitting: Cardiology

## 2011-12-04 ENCOUNTER — Telehealth (INDEPENDENT_AMBULATORY_CARE_PROVIDER_SITE_OTHER): Payer: Self-pay | Admitting: General Surgery

## 2011-12-04 NOTE — Telephone Encounter (Signed)
Called patient home number and left message to advise patient that the appointment made on 12/13/11 at 2:30. Advised in message to arrive at 2:00 to complete registration process and to call our office if there are any questions regarding the appointment.  Spoke with Malachi Bonds at [facility] and requested copy of the recent progress notes and any tests related to patient referral to our office. Malachi Bonds agreed.

## 2011-12-12 ENCOUNTER — Encounter (INDEPENDENT_AMBULATORY_CARE_PROVIDER_SITE_OTHER): Payer: Self-pay | Admitting: General Surgery

## 2011-12-13 ENCOUNTER — Ambulatory Visit (INDEPENDENT_AMBULATORY_CARE_PROVIDER_SITE_OTHER): Payer: Medicare Other | Admitting: General Surgery

## 2011-12-13 ENCOUNTER — Encounter (INDEPENDENT_AMBULATORY_CARE_PROVIDER_SITE_OTHER): Payer: Self-pay | Admitting: General Surgery

## 2011-12-13 VITALS — BP 118/70 | HR 72 | Temp 97.3°F | Resp 16 | Ht 69.0 in | Wt 178.5 lb

## 2011-12-13 DIAGNOSIS — K4090 Unilateral inguinal hernia, without obstruction or gangrene, not specified as recurrent: Secondary | ICD-10-CM

## 2011-12-13 NOTE — Patient Instructions (Addendum)
On physical exam, you have a small but obvious left inguinal hernia. This is almost certainly the source of her pain.  We have discussed options for repair of the hernia and timing of this. You have decided to go ahead and have this repaired in the near future.  You will be scheduled for laparoscopic repair of your left inguinal hernia with mesh. We will try to do this on September 3 if the hospital operating room schedule can accommodate you.    Hernia Repair with Laparoscope A hernia occurs when an internal organ pushes out through a weak spot in the belly (abdominal) wall muscles. Hernias most commonly occur in the groin and around the navel. Hernias can also occur through a cut by the surgeon (incision) after an abdominal operation. A hernia may be caused by:  Lifting heavy objects.   Prolonged coughing.   Straining to move your bowels.  Hernias can often be pushed back into place (reduced). Most hernias tend to get worse over time. Problems occur when abdominal contents get stuck in the opening and the blood supply is blocked or impaired (incarcerated hernia). Because of these risks, you require surgery to repair the hernia. Your hernia will be repaired using a laparoscope. Laparoscopic surgery is a type of minimally invasive surgery. It does not involve making a typical surgical cut (incision) in the skin. A laparoscope is a telescope-like rod and lens system. It is usually connected to a video camera and a light source so your caregiver can clearly see the operative area. The instruments are inserted through  to  inch (5 mm or 10 mm) openings in the skin at specific locations. A working and viewing space is created by blowing a small amount of carbon dioxide gas into the abdominal cavity. The abdomen is essentially blown up like a balloon (insufflated). This elevates the abdominal wall above the internal organs like a dome. The carbon dioxide gas is common to the human body and can be  absorbed by tissue and removed by the respiratory system. Once the repair is completed, the small incisions will be closed with either stitches (sutures) or staples (just like a paper stapler only this staple holds the skin together). LET YOUR CAREGIVERS KNOW ABOUT:  Allergies.   Medications taken including herbs, eye drops, over the counter medications, and creams.   Use of steroids (by mouth or creams).   Previous problems with anesthetics or Novocaine.   Possibility of pregnancy, if this applies.   History of blood clots (thrombophlebitis).   History of bleeding or blood problems.   Previous surgery.   Other health problems.  BEFORE THE PROCEDURE  Laparoscopy can be done either in a hospital or out-patient clinic. You may be given a mild sedative to help you relax before the procedure. Once in the operating room, you will be given a general anesthesia to make you sleep (unless you and your caregiver choose a different anesthetic).  AFTER THE PROCEDURE  After the procedure you will be watched in a recovery area. Depending on what type of hernia was repaired, you might be admitted to the hospital or you might go home the same day. With this procedure you may have less pain and scarring. This usually results in a quicker recovery and less risk of infection. HOME CARE INSTRUCTIONS   Bed rest is not required. You may continue your normal activities but avoid heavy lifting (more than 10 pounds) or straining.   Cough gently. If you are a smoker  it is best to stop, as even the best hernia repair can break down with the continual strain of coughing.   Avoid driving until given the OK by your surgeon.   There are no dietary restrictions unless given otherwise.   TAKE ALL MEDICATIONS AS DIRECTED.   Only take over-the-counter or prescription medicines for pain, discomfort, or fever as directed by your caregiver.  SEEK MEDICAL CARE IF:   There is increasing abdominal pain or pain in your  incisions.   There is more bleeding from incisions, other than minimal spotting.   You feel light headed or faint.   You develop an unexplained fever, chills, and/or an oral temperature above 102 F (38.9 C).   You have redness, swelling, or increasing pain in the wound.   Pus coming from wound.   A foul smell coming from the wound or dressings.  SEEK IMMEDIATE MEDICAL CARE IF:   You develop a rash.   You have difficulty breathing.   You have any allergic problems.  MAKE SURE YOU:   Understand these instructions.   Will watch your condition.   Will get help right away if you are not doing well or get worse.  Document Released: 04/08/2005 Document Revised: 03/28/2011 Document Reviewed: 03/08/2009 [facility] Patient Information 2012 [place], [place].

## 2011-12-13 NOTE — Progress Notes (Signed)
Patient ID: William Soltau., male   DOB: 09/04/1940, 71 y.o.   MRN: [id number]  Chief Complaint  Patient presents with  . Inguinal Hernia    HPI William Mussa. is a 71 y.o. male.  He is referred to me by Dr. Eric Form for evaluation of left groin pain, possible left inguinal hernia.  William Swanson gives a 4 week history of intermittent left inguinal pain. He doesn't really feel a bulge. He thought this was related to exercise at first but now it doesn't seem to be.   He can do sit ups and walk on elliptical and doesn't really hurt him. He's had 5 episodes where the pain was moderately severe, but had no episodes in the past 10 days. He has backed off on exercise. The pain doesn't bother him it might have been no change in appetite or bowel movements. No change in urinary symptoms.  He is followed by Dr. Mena Goes at [facility] urology for urinary frequency and BPH. This has improved on Uroxatral.  He has been evaluated by Dr. Clelia Croft twice. . The first time there was no evidence of hernia. The second visit he states that Dr. Clelia Croft was somewhat suspicious of hernia.  Last colonoscopy 10 years ago. He knows that he used to do this and is being scheduled for that. HPI  Past Medical History  Diagnosis Date  . BPH (benign prostatic hyperplasia)     mild  . Frequency of urination   . Hyperlipemia   . No pertinent past medical history   . Glaucoma     suspect  . Claustrophobia   . HSV-1 (herpes simplex virus 1) infection   . IFG (impaired fasting glucose)   . Osteopenia   . Weight loss   . OAB (overactive bladder)   . Spinal stenosis   . Bilateral carpal tunnel syndrome   . Humerus fracture   . Morton's neuroma   . Left groin pain     Past Surgical History  Procedure Date  . Tonsillectomy   . Colonoscopy   . Foot neuroma surgery     lt foot  . Lumbar epidural injection   . Carpal tunnel release 07/23/2011    Procedure: CARPAL TUNNEL RELEASE;  Surgeon: Nicki Reaper, MD;  Location: [facility]  [place];  Service: Orthopedics;  Laterality: Left;    Family History  Problem Relation Age of Onset  . Heart disease Father   . Cancer Mother     ovarian  . Nephrolithiasis Brother     Social History History  Substance Use Topics  . Smoking status: Former Smoker    Quit date: 07/17/1975  . Smokeless tobacco: Never Used  . Alcohol Use: Yes     daily wine    No Known Allergies  Current Outpatient Prescriptions  Medication Sig Dispense Refill  . fluocinonide gel (LIDEX) 0.05 % as needed.      . meclizine (ANTIVERT) 25 MG tablet as needed.      . metroNIDAZOLE (METROGEL) 0.75 % gel as needed.      Marland Kitchen acyclovir (ZOVIRAX) 800 MG tablet Take 800 mg by mouth as needed.      Marland Kitchen alfuzosin (UROXATRAL) 10 MG 24 hr tablet Take 10 mg by mouth daily.      Marland Kitchen ALPRAZolam (XANAX) 0.5 MG tablet Take 0.5 mg by mouth at bedtime as needed.      Marland Kitchen aspirin 81 MG tablet Take 81 mg by mouth daily.      Marland Kitchen  atorvastatin (LIPITOR) 20 MG tablet Take 20 mg by mouth daily.      . calcium citrate (CALCITRATE - DOSED IN MG ELEMENTAL CALCIUM) 950 MG tablet Take 1 tablet by mouth daily.      . cholecalciferol (VITAMIN D) 1000 UNITS tablet Take 1,000 Units by mouth daily.      . diazepam (VALIUM) 5 MG tablet Take 5 mg by mouth every 8 (eight) hours as needed.      Marland Kitchen EPINEPHrine (EPIPEN 2-PAK) 0.3 mg/0.3 mL DEVI Inject 0.3 mg into the muscle as needed.      . finasteride (PROSCAR) 5 MG tablet Take 5 mg by mouth every other day.      . fish oil-omega-3 fatty acids 1000 MG capsule Take 2 g by mouth daily.      . hydrocortisone butyrate (LUCOID) 0.1 % CREA cream       . INTERMEZZO 3.5 MG SUBL as directed.       . Multiple Vitamin (MULTIVITAMIN) tablet Take 1 tablet by mouth daily.      . Timolol Maleate (ISTALOL) 0.5 % (DAILY) SOLN Apply to eye daily.      . valACYclovir (VALTREX) 1000 MG tablet Take 1,000 mg by mouth as needed.      . vitamin C (ASCORBIC ACID) 500 MG tablet Take 500 mg by mouth daily.       . zaleplon (SONATA) 10 MG capsule Take 10 mg by mouth at bedtime.        Review of Systems Review of Systems  Constitutional: Negative for fever, chills and unexpected weight change.  HENT: Negative for hearing loss, congestion, sore throat, trouble swallowing and voice change.   Eyes: Negative for visual disturbance.  Respiratory: Negative for cough and wheezing.   Cardiovascular: Negative for chest pain, palpitations and leg swelling.  Gastrointestinal: Negative for nausea, vomiting, abdominal pain, diarrhea, constipation, blood in stool, abdominal distention, anal bleeding and rectal pain.  Genitourinary: Positive for urgency and frequency. Negative for hematuria and difficulty urinating.  Musculoskeletal: Positive for back pain. Negative for arthralgias.  Skin: Negative for rash and wound.  Neurological: Negative for seizures, syncope, weakness and headaches.  Hematological: Negative for adenopathy. Does not bruise/bleed easily.  Psychiatric/Behavioral: Negative for confusion.    Blood pressure 118/70, pulse 72, temperature 97.3 F (36.3 C), temperature source Temporal, resp. rate 16, height 5\' 9"  (1.753 m), weight 178 lb 8 oz (80.967 kg).  Physical Exam Physical Exam  Constitutional: He is oriented to person, place, and time. He appears well-developed and well-nourished. No distress.  HENT:  Head: Normocephalic.  Nose: Nose normal.  Mouth/Throat: No oropharyngeal exudate.  Eyes: Conjunctivae and EOM are normal. Pupils are equal, round, and reactive to light. Right eye exhibits no discharge. Left eye exhibits no discharge. No scleral icterus.  Neck: Normal range of motion. Neck supple. No JVD present. No tracheal deviation present. No thyromegaly present.  Cardiovascular: Normal rate, regular rhythm, normal heart sounds and intact distal pulses.   No murmur heard. Pulmonary/Chest: Effort normal and breath sounds normal. No stridor. No respiratory distress. He has no wheezes.  He has no rales. He exhibits no tenderness.  Abdominal: Soft. Bowel sounds are normal. He exhibits no distension and no mass. There is no tenderness. There is no rebound and no guarding.       A small but clearcut left inguinal hernia, reducible. No evidence of hernia on the right. Penis scrotum and testes are normal. No abdominal mass or tenderness.  Musculoskeletal: Normal  range of motion. He exhibits no edema and no tenderness.  Lymphadenopathy:    He has no cervical adenopathy.  Neurological: He is alert and oriented to person, place, and time. He has normal reflexes. Coordination normal.  Skin: Skin is warm and dry. No rash noted. He is not diaphoretic. No erythema. No pallor.  Psychiatric: He has a normal mood and affect. His behavior is normal. Judgment and thought content normal.    Data Reviewed Office notes from Dr. Buren Kos  Assessment    Symptomatic left inguinal hernia, does not extend into the scrotum.  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 He states that he would like to go out [place] at the end of September and hopes to have the surgery on September 3. We will try to accommodate him if the schedule will allow.  We we talked about techniques of open repair and laparoscopic repair.   At the end of the lengthy discussion he would like to have a laparoscopic repair  I explained the indications, details, techniques, and numerous risks of this procedure with him. He is aware of the small chance of having to convert this to an open procedure. His questions were answered. He understands all these issues. He agrees with this plan.       Angelia Mould. Derrell Lolling, M.D., [facility] General and Minimally invasive Surgery Breast and Colorectal Surgery Office:   [phone] Pager:    [phone]  12/13/2011, 3:06 PM

## 2011-12-17 ENCOUNTER — Encounter (HOSPITAL_COMMUNITY): Payer: Self-pay | Admitting: Pharmacy Technician

## 2011-12-19 NOTE — Progress Notes (Addendum)
08-22-11 EKG Epic, Stress test 09-04-11 Epic, 08-27-11 Echo Epic,04-04-12 Chest CT Epic 08-22-11 Office visit with Dr. Benito Mccreedy

## 2011-12-20 ENCOUNTER — Encounter (HOSPITAL_COMMUNITY)
Admission: RE | Admit: 2011-12-20 | Discharge: 2011-12-20 | Disposition: A | Discharge status: Home / Self Care | Payer: Medicare Other | Source: Ambulatory Visit | Attending: General Surgery | Admitting: General Surgery

## 2011-12-20 ENCOUNTER — Encounter (HOSPITAL_COMMUNITY): Payer: Self-pay

## 2011-12-20 LAB — CBC
Hemoglobin: 15.1 g/dL (ref 13.0–17.0)
MCH: 31.1 pg (ref 26.0–34.0)
Platelets: 219 10*3/uL (ref 150–400)
RBC: 4.85 MIL/uL (ref 4.22–5.81)
WBC: 6 10*3/uL (ref 4.0–10.5)

## 2011-12-20 LAB — SURGICAL PCR SCREEN
MRSA, PCR: NEGATIVE
Staphylococcus aureus: POSITIVE — AB

## 2011-12-20 NOTE — Patient Instructions (Signed)
20 SHEP PORTER  12/20/2011   Your procedure is scheduled on:  12-24-2011  Report to [facility] at 0530  AM.  Call this number if you have problems the morning of surgery: [phone]   Remember:wife sandy cell [phone] driver and stay with for 24 hours after surgery   Do not eat food or drink liquids:After Midnight.  .  Take these medicines the morning of surgery with A SIP OF WATER: lipitor, istalol  Eye drop   Do not wear jewelry or make up.  Do not wear lotions, powders, or perfumes.Do not wear deodorant.    Do not bring valuables to the hospital.  Contacts, dentures or bridgework may not be worn into surgery.  Leave suitcase in the car. After surgery it may be brought to your room.  For patients admitted to the hospital, checkout time is 11:00 AM the day of discharge                             Patients discharged the day of surgery will not be allowed to drive home. If going home same day of surgery, you must have someone stay with you the first 24 hours at home and arrange for some one to drive you home from hospital.    Special Instructions: CHG Shower Use Special Wash: 1/2 bottle night before surgery and 1/2 bottle morning  of surgery, use regular soap on face and front and back private area. Women do not shave legs or underarms for 2 days before showers. Men may shave face morning of surgery.    Please read over the following fact sheets that you were given: MRSA Information  [facility] pre op nurse phone number [phone], call if needed

## 2011-12-20 NOTE — H&P (Signed)
[name]    MRN: [id number]   Description: 71 year old male  Provider: Ernestene Mention, MD  Department: [facility]     Diagnoses     Left inguinal hernia   - Primary    550.90       Vitals -    BP Pulse Temp Resp Ht Wt    118/70 72 97.3 F (36.3 C) (Temporal) 16 5\' 9"  (1.753 m) 178 lb 8 oz (80.967 kg)   BMI - 26.36 kg/m2                 History and Physical   William Mention, MD   Patient ID: William Swanson., male   DOB: 01-Oct-1940, 71 y.o.   MRN: [id number]              HPI William Swanson. is a 71 y.o. male.  He is referred to me by Dr. Eric Swanson for evaluation of left groin pain, possible left inguinal hernia.   Boe gives a 4 week history of intermittent left inguinal pain. He doesn't really feel a bulge. He thought this was related to exercise at first but now it doesn't seem to be.   He can do sit ups and walk on elliptical and doesn't really hurt him. He's had 5 episodes where the pain was moderately severe, but had no episodes in the past 10 days. He has backed off on exercise. The pain doesn't bother him it might have been no change in appetite or bowel movements. No change in urinary symptoms.   He is followed by Dr. Mena Swanson at [facility] urology for urinary frequency and BPH. This has improved on Uroxatral.   He has been evaluated by Dr. Clelia Swanson twice. . The first time there was no evidence of hernia. The second visit he states that Dr. Clelia Swanson was somewhat suspicious of hernia.   Last colonoscopy 10 years ago. He knows that he used to do this and is being scheduled for that.     Past Medical History   Diagnosis  Date   .  BPH (benign prostatic hyperplasia)         mild   .  Frequency of urination     .  Hyperlipemia     .  No pertinent past medical history     .  Glaucoma         suspect   .  Claustrophobia     .  HSV-1 (herpes simplex virus 1) infection     .  IFG (impaired fasting glucose)     .  Osteopenia     .  Weight  loss     .  OAB (overactive bladder)     .  Spinal stenosis     .  Bilateral carpal tunnel syndrome     .  Humerus fracture     .  Morton's neuroma     .  Left groin pain         Past Surgical History   Procedure  Date   .  Tonsillectomy     .  Colonoscopy     .  Foot neuroma surgery         lt foot   .  Lumbar epidural injection     .  Carpal tunnel release  07/23/2011       Procedure: CARPAL TUNNEL RELEASE;  Surgeon: Nicki Reaper, MD;  Location: [facility];  Service: Orthopedics;  Laterality: Left;       Family History   Problem  Relation  Age of Onset   .  Heart disease  Father     .  Cancer  Mother         ovarian   .  Nephrolithiasis  Brother        Social History History   Substance Use Topics   .  Smoking status:  Former Smoker       Quit date:  07/17/1975   .  Smokeless tobacco:  Never Used   .  Alcohol Use:  Yes         daily wine      No Known Allergies    Current Outpatient Prescriptions   Medication  Sig  Dispense  Refill   .  fluocinonide gel (LIDEX) 0.05 %  as needed.         .  meclizine (ANTIVERT) 25 MG tablet  as needed.         .  metroNIDAZOLE (METROGEL) 0.75 % gel  as needed.         Marland Kitchen  acyclovir (ZOVIRAX) 800 MG tablet  Take 800 mg by mouth as needed.         Marland Kitchen  alfuzosin (UROXATRAL) 10 MG 24 hr tablet  Take 10 mg by mouth daily.         Marland Kitchen  ALPRAZolam (XANAX) 0.5 MG tablet  Take 0.5 mg by mouth at bedtime as needed.         Marland Kitchen  aspirin 81 MG tablet  Take 81 mg by mouth daily.         Marland Kitchen  atorvastatin (LIPITOR) 20 MG tablet  Take 20 mg by mouth daily.         .  calcium citrate (CALCITRATE - DOSED IN MG ELEMENTAL CALCIUM) 950 MG tablet  Take 1 tablet by mouth daily.         .  cholecalciferol (VITAMIN D) 1000 UNITS tablet  Take 1,000 Units by mouth daily.         .  diazepam (VALIUM) 5 MG tablet  Take 5 mg by mouth every 8 (eight) hours as needed.         Marland Kitchen  EPINEPHrine (EPIPEN 2-PAK) 0.3 mg/0.3 mL DEVI  Inject 0.3 mg into the  muscle as needed.         .  finasteride (PROSCAR) 5 MG tablet  Take 5 mg by mouth every other day.         .  fish oil-omega-3 fatty acids 1000 MG capsule  Take 2 g by mouth daily.         .  hydrocortisone butyrate (LUCOID) 0.1 % CREA cream           .  INTERMEZZO 3.5 MG SUBL  as directed.          .  Multiple Vitamin (MULTIVITAMIN) tablet  Take 1 tablet by mouth daily.         .  Timolol Maleate (ISTALOL) 0.5 % (DAILY) SOLN  Apply to eye daily.         .  valACYclovir (VALTREX) 1000 MG tablet  Take 1,000 mg by mouth as needed.         .  vitamin C (ASCORBIC ACID) 500 MG tablet  Take 500 mg by mouth daily.         .  zaleplon (SONATA) 10 MG capsule  Take  10 mg by mouth at bedtime.            Review of Systems   Constitutional: Negative for fever, chills and unexpected weight change.  HENT: Negative for hearing loss, congestion, sore throat, trouble swallowing and voice change.   Eyes: Negative for visual disturbance.  Respiratory: Negative for cough and wheezing.   Cardiovascular: Negative for chest pain, palpitations and leg swelling.  Gastrointestinal: Negative for nausea, vomiting, abdominal pain, diarrhea, constipation, blood in stool, abdominal distention, anal bleeding and rectal pain.  Genitourinary: Positive for urgency and frequency. Negative for hematuria and difficulty urinating.  Musculoskeletal: Positive for back pain. Negative for arthralgias.  Skin: Negative for rash and wound.  Neurological: Negative for seizures, syncope, weakness and headaches.  Hematological: Negative for adenopathy. Does not bruise/bleed easily.  Psychiatric/Behavioral: Negative for confusion.    Blood pressure 118/70, pulse 72, temperature 97.3 F (36.3 C), temperature source Temporal, resp. rate 16, height 5\' 9"  (1.753 m), weight 178 lb 8 oz (80.967 kg).   Physical Exam   Constitutional: He is oriented to person, place, and time. He appears well-developed and well-nourished. No distress.    HENT:   Head: Normocephalic.   Nose: Nose normal.   Mouth/Throat: No oropharyngeal exudate.  Eyes: Conjunctivae and EOM are normal. Pupils are equal, round, and reactive to light. Right eye exhibits no discharge. Left eye exhibits no discharge. No scleral icterus.  Neck: Normal range of motion. Neck supple. No JVD present. No tracheal deviation present. No thyromegaly present.  Cardiovascular: Normal rate, regular rhythm, normal heart sounds and intact distal pulses.    No murmur heard. Pulmonary/Chest: Effort normal and breath sounds normal. No stridor. No respiratory distress. He has no wheezes. He has no rales. He exhibits no tenderness.  Abdominal: Soft. Bowel sounds are normal. He exhibits no distension and no mass. There is no tenderness. There is no rebound and no guarding.       A small but clearcut left inguinal hernia, reducible. No evidence of hernia on the right. Penis scrotum and testes are normal. No abdominal mass or tenderness.  Musculoskeletal: Normal range of motion. He exhibits no edema and no tenderness.  Lymphadenopathy:    He has no cervical adenopathy.  Neurological: He is alert and oriented to person, place, and time. He has normal reflexes. Coordination normal.  Skin: Skin is warm and dry. No rash noted. He is not diaphoretic. No erythema. No pallor.  Psychiatric: He has a normal mood and affect. His behavior is normal. Judgment and thought content normal.    Data Reviewed Office notes from Dr. Buren Kos   Assessment Symptomatic left inguinal hernia, does not extend into the scrotum.   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 He states that he would like to go out [place] at the end of September and hopes to have the surgery on September 3. We will try to accommodate him if the schedule will  allow.   We we talked about techniques of open repair and laparoscopic repair.   At the end of the lengthy discussion he would like to have a laparoscopic repair   I explained the indications, details, techniques, and numerous risks of this procedure with him. He is aware of the small chance of having to convert this to an open procedure. His questions were answered. He  understands all these issues. He agrees with this plan.       Angelia Mould. Derrell Lolling, M.D., [facility] General and Minimally invasive Surgery Breast and Colorectal Surgery Office:   [phone] Pager:   [phone]

## 2011-12-23 NOTE — Anesthesia Preprocedure Evaluation (Addendum)
Anesthesia Evaluation  Patient identified by MRN, date of birth, ID band Patient awake    Reviewed: Allergy & Precautions, H&P , NPO status , Patient's Chart, lab work & pertinent test results  Airway Mallampati: III TM Distance: >3 FB Neck ROM: Full    Dental  (+) Teeth Intact and Dental Advisory Given   Pulmonary neg pulmonary ROS,  breath sounds clear to auscultation  Pulmonary exam normal       Cardiovascular - angina- CAD and - CHF Rhythm:Regular Rate:Normal     Neuro/Psych negative neurological ROS     GI/Hepatic negative GI ROS, Neg liver ROS,   Endo/Other  negative endocrine ROS  Renal/GU negative Renal ROS     Musculoskeletal negative musculoskeletal ROS (+)   Abdominal (+) - obese,   Peds  Hematology negative hematology ROS (+)   Anesthesia Other Findings   Reproductive/Obstetrics                          Anesthesia Physical Anesthesia Plan  ASA: II  Anesthesia Plan: General   Post-op Pain Management:    Induction: Intravenous  Airway Management Planned: Oral ETT  Additional Equipment:   Intra-op Plan:   Post-operative Plan: Extubation in OR  Informed Consent: I have reviewed the patients History and Physical, chart, labs and discussed the procedure including the risks, benefits and alternatives for the proposed anesthesia with the patient or authorized representative who has indicated his/her understanding and acceptance.   Dental advisory given  Plan Discussed with: CRNA and Surgeon  Anesthesia Plan Comments:         Anesthesia Quick Evaluation

## 2011-12-24 ENCOUNTER — Ambulatory Visit (HOSPITAL_COMMUNITY): Payer: Medicare Other | Admitting: Anesthesiology

## 2011-12-24 ENCOUNTER — Encounter (HOSPITAL_COMMUNITY): Payer: Self-pay | Admitting: Anesthesiology

## 2011-12-24 ENCOUNTER — Encounter (HOSPITAL_COMMUNITY): Payer: Self-pay | Admitting: *Deleted

## 2011-12-24 ENCOUNTER — Ambulatory Visit (HOSPITAL_COMMUNITY)
Admission: RE | Admit: 2011-12-24 | Discharge: 2011-12-24 | Disposition: A | Discharge status: Home / Self Care | Payer: Medicare Other | Source: Ambulatory Visit | Attending: General Surgery | Admitting: General Surgery

## 2011-12-24 ENCOUNTER — Encounter (HOSPITAL_COMMUNITY)
Admission: RE | Disposition: A | Discharge status: Home / Self Care | Payer: Self-pay | Source: Ambulatory Visit | Attending: General Surgery

## 2011-12-24 DIAGNOSIS — Z01812 Encounter for preprocedural laboratory examination: Secondary | ICD-10-CM | POA: Insufficient documentation

## 2011-12-24 DIAGNOSIS — K4090 Unilateral inguinal hernia, without obstruction or gangrene, not specified as recurrent: Secondary | ICD-10-CM | POA: Diagnosis present

## 2011-12-24 HISTORY — PX: INGUINAL HERNIA REPAIR: SHX194

## 2011-12-24 SURGERY — INSERTION OF MESH
Anesthesia: General | Site: Abdomen | Laterality: Left | Wound class: Clean

## 2011-12-24 MED ORDER — FENTANYL CITRATE 0.05 MG/ML IJ SOLN
INTRAMUSCULAR | Status: DC | PRN
  Administered 2011-12-24: 50 ug via INTRAVENOUS
  Administered 2011-12-24: 100 ug via INTRAVENOUS

## 2011-12-24 MED ORDER — 0.9 % SODIUM CHLORIDE (POUR BTL) OPTIME
TOPICAL | Status: DC | PRN
  Administered 2011-12-24: 1000 mL

## 2011-12-24 MED ORDER — CISATRACURIUM BESYLATE (PF) 10 MG/5ML IV SOLN
INTRAVENOUS | Status: DC | PRN
  Administered 2011-12-24: 8 mg via INTRAVENOUS
  Administered 2011-12-24: 2 mg via INTRAVENOUS

## 2011-12-24 MED ORDER — ACETAMINOPHEN 10 MG/ML IV SOLN
INTRAVENOUS | Status: AC
  Filled 2011-12-24: qty 100

## 2011-12-24 MED ORDER — EPHEDRINE SULFATE 50 MG/ML IJ SOLN
INTRAMUSCULAR | Status: DC | PRN
  Administered 2011-12-24: 7.5 mg via INTRAVENOUS

## 2011-12-24 MED ORDER — ACETAMINOPHEN 10 MG/ML IV SOLN: 1000.0000 mg | Freq: Once | INTRAVENOUS | Status: DC | PRN

## 2011-12-24 MED ORDER — MEPERIDINE HCL 50 MG/ML IJ SOLN: 6.2500 mg | INTRAMUSCULAR | Status: DC | PRN

## 2011-12-24 MED ORDER — SODIUM CHLORIDE 0.9 % IV SOLN
INTRAVENOUS | Status: DC
  Administered 2011-12-24: 10:00:00 via INTRAVENOUS

## 2011-12-24 MED ORDER — OXYCODONE HCL 5 MG PO TABS: 5.0000 mg | ORAL_TABLET | Freq: Once | ORAL | Status: DC | PRN

## 2011-12-24 MED ORDER — MIDAZOLAM HCL 5 MG/5ML IJ SOLN
INTRAMUSCULAR | Status: DC | PRN
  Administered 2011-12-24: 1 mg via INTRAVENOUS

## 2011-12-24 MED ORDER — ACETAMINOPHEN 10 MG/ML IV SOLN
INTRAVENOUS | Status: DC | PRN
  Administered 2011-12-24: 1000 mg via INTRAVENOUS

## 2011-12-24 MED ORDER — HYDROCODONE-ACETAMINOPHEN 5-325 MG PO TABS: 1.0000 | ORAL_TABLET | ORAL | Status: AC | PRN

## 2011-12-24 MED ORDER — LACTATED RINGERS IV SOLN
INTRAVENOUS | Status: DC | PRN
  Administered 2011-12-24 (×2): via INTRAVENOUS

## 2011-12-24 MED ORDER — ONDANSETRON HCL 4 MG/2ML IJ SOLN
INTRAMUSCULAR | Status: DC | PRN
  Administered 2011-12-24: 4 mg via INTRAVENOUS

## 2011-12-24 MED ORDER — PROPOFOL 10 MG/ML IV EMUL
INTRAVENOUS | Status: DC | PRN
  Administered 2011-12-24: 170 mg via INTRAVENOUS

## 2011-12-24 MED ORDER — BUPIVACAINE-EPINEPHRINE 0.5% -1:200000 IJ SOLN
INTRAMUSCULAR | Status: DC | PRN
  Administered 2011-12-24: 16 mL

## 2011-12-24 MED ORDER — GLYCOPYRROLATE 0.2 MG/ML IJ SOLN
INTRAMUSCULAR | Status: DC | PRN
  Administered 2011-12-24: 0.2 mg via INTRAVENOUS
  Administered 2011-12-24: 0.6 mg via INTRAVENOUS

## 2011-12-24 MED ORDER — BUPIVACAINE-EPINEPHRINE (PF) 0.5% -1:200000 IJ SOLN
INTRAMUSCULAR | Status: AC
  Filled 2011-12-24: qty 10

## 2011-12-24 MED ORDER — CEFAZOLIN SODIUM-DEXTROSE 2-3 GM-% IV SOLR
2.0000 g | INTRAVENOUS | Status: AC
  Administered 2011-12-24: 2 g via INTRAVENOUS

## 2011-12-24 MED ORDER — MUPIROCIN 2 % EX OINT
TOPICAL_OINTMENT | Freq: Two times a day (BID) | CUTANEOUS | Status: DC
  Filled 2011-12-24: qty 22

## 2011-12-24 MED ORDER — CEFAZOLIN SODIUM-DEXTROSE 2-3 GM-% IV SOLR
INTRAVENOUS | Status: AC
  Filled 2011-12-24: qty 50

## 2011-12-24 MED ORDER — HYDROMORPHONE HCL PF 1 MG/ML IJ SOLN: 0.2500 mg | INTRAMUSCULAR | Status: DC | PRN

## 2011-12-24 MED ORDER — OXYCODONE HCL 5 MG/5ML PO SOLN
5.0000 mg | Freq: Once | ORAL | Status: DC | PRN
  Filled 2011-12-24: qty 5

## 2011-12-24 MED ORDER — KETOROLAC TROMETHAMINE 15 MG/ML IJ SOLN
INTRAMUSCULAR | Status: DC | PRN
  Administered 2011-12-24: 15 mg via INTRAVENOUS

## 2011-12-24 MED ORDER — NEOSTIGMINE METHYLSULFATE 1 MG/ML IJ SOLN
INTRAMUSCULAR | Status: DC | PRN
  Administered 2011-12-24: 4 mg via INTRAVENOUS

## 2011-12-24 MED ORDER — PROMETHAZINE HCL 25 MG/ML IJ SOLN: 6.2500 mg | INTRAMUSCULAR | Status: DC | PRN

## 2011-12-24 SURGICAL SUPPLY — 62 items
ADH SKN CLS APL DERMABOND .7 (GAUZE/BANDAGES/DRESSINGS) ×4
APL SKNCLS STERI-STRIP NONHPOA (GAUZE/BANDAGES/DRESSINGS)
APPLIER CLIP LOGIC TI 5 (MISCELLANEOUS) ×2 IMPLANT
APR CLP MED LRG 33X5 (MISCELLANEOUS) ×2
BENZOIN TINCTURE PRP APPL 2/3 (GAUZE/BANDAGES/DRESSINGS) IMPLANT
BLADE HEX COATED 2.75 (ELECTRODE) ×3 IMPLANT
BLADE SURG 15 STRL LF DISP TIS (BLADE) ×2 IMPLANT
BLADE SURG 15 STRL SS (BLADE) ×3
BLADE SURG SZ10 CARB STEEL (BLADE) IMPLANT
CABLE HIGH FREQUENCY MONO STRZ (ELECTRODE) ×2 IMPLANT
CANISTER SUCTION 2500CC (MISCELLANEOUS) ×3 IMPLANT
CLOTH BEACON ORANGE TIMEOUT ST (SAFETY) ×3 IMPLANT
DECANTER SPIKE VIAL GLASS SM (MISCELLANEOUS) ×5 IMPLANT
DERMABOND ADVANCED (GAUZE/BANDAGES/DRESSINGS) ×2
DERMABOND ADVANCED .7 DNX12 (GAUZE/BANDAGES/DRESSINGS) ×2 IMPLANT
DISSECT BALLN SPACEMKR + OVL (BALLOONS) ×3
DISSECTOR BALLN SPACEMKR + OVL (BALLOONS) ×1 IMPLANT
DRAIN PENROSE 18X1/2 LTX STRL (DRAIN) IMPLANT
DRAPE CAMERA CLOSED 9X96 (DRAPES) ×2 IMPLANT
DRAPE LAPAROSCOPIC ABDOMINAL (DRAPES) ×2 IMPLANT
DRAPE LAPAROTOMY TRNSV 102X78 (DRAPE) ×3 IMPLANT
ELECT REM PT RETURN 9FT ADLT (ELECTROSURGICAL) ×3
ELECTRODE REM PT RTRN 9FT ADLT (ELECTROSURGICAL) ×2 IMPLANT
GLOVE BIOGEL PI IND STRL 7.0 (GLOVE) ×2 IMPLANT
GLOVE BIOGEL PI INDICATOR 7.0 (GLOVE) ×1
GLOVE EUDERMIC 7 POWDERFREE (GLOVE) ×3 IMPLANT
GOWN STRL NON-REIN LRG LVL3 (GOWN DISPOSABLE) ×5 IMPLANT
GOWN STRL REIN XL XLG (GOWN DISPOSABLE) ×8 IMPLANT
KIT BASIN OR (CUSTOM PROCEDURE TRAY) ×3 IMPLANT
MESH ULTRAPRO 3X6 7.6X15CM (Mesh General) ×2 IMPLANT
NDL HYPO 25X1 1.5 SAFETY (NEEDLE) ×1 IMPLANT
NEEDLE HYPO 25X1 1.5 SAFETY (NEEDLE) ×3 IMPLANT
NS IRRIG 1000ML POUR BTL (IV SOLUTION) ×3 IMPLANT
PACK BASIC VI WITH GOWN DISP (CUSTOM PROCEDURE TRAY) ×3 IMPLANT
PENCIL BUTTON HOLSTER BLD 10FT (ELECTRODE) ×3 IMPLANT
SCISSORS LAP 5X35 DISP (ENDOMECHANICALS) ×2 IMPLANT
SET IRRIG TUBING LAPAROSCOPIC (IRRIGATION / IRRIGATOR) ×2 IMPLANT
SPONGE GAUZE 4X4 12PLY (GAUZE/BANDAGES/DRESSINGS) IMPLANT
SPONGE LAP 4X18 X RAY DECT (DISPOSABLE) ×3 IMPLANT
STAPLER VISISTAT 35W (STAPLE) IMPLANT
STRIP CLOSURE SKIN 1/2X4 (GAUZE/BANDAGES/DRESSINGS) IMPLANT
SUT MNCRL AB 4-0 PS2 18 (SUTURE) ×5 IMPLANT
SUT PROLENE 2 0 CT2 30 (SUTURE) ×9 IMPLANT
SUT SILK 2 0 (SUTURE) ×3
SUT SILK 2 0 SH (SUTURE) IMPLANT
SUT SILK 2-0 18XBRD TIE 12 (SUTURE) ×2 IMPLANT
SUT VIC AB 2-0 SH 27 (SUTURE) ×3
SUT VIC AB 2-0 SH 27X BRD (SUTURE) ×2 IMPLANT
SUT VIC AB 3-0 SH 27 (SUTURE) ×3
SUT VIC AB 3-0 SH 27XBRD (SUTURE) ×2 IMPLANT
SUT VICRYL 0 UR6 27IN ABS (SUTURE) ×2 IMPLANT
SUT VICRYL 2 0 18  UND BR (SUTURE)
SUT VICRYL 2 0 18 UND BR (SUTURE) IMPLANT
SYR BULB IRRIGATION 50ML (SYRINGE) ×3 IMPLANT
SYR CONTROL 10ML LL (SYRINGE) ×3 IMPLANT
TACKER 5MM HERNIA 3.5CML NAB (ENDOMECHANICALS) ×2 IMPLANT
TOWEL OR 17X26 10 PK STRL BLUE (TOWEL DISPOSABLE) ×3 IMPLANT
TOWEL OR NON WOVEN STRL DISP B (DISPOSABLE) ×2 IMPLANT
TRAY FOLEY CATH 14FRSI W/METER (CATHETERS) ×2 IMPLANT
TROCAR BLADELESS OPT 5 75 (ENDOMECHANICALS) ×4 IMPLANT
TUBING INSUFFLATION 10FT LAP (TUBING) ×2 IMPLANT
YANKAUER SUCT BULB TIP 10FT TU (MISCELLANEOUS) IMPLANT

## 2011-12-24 NOTE — Progress Notes (Signed)
Dr. Renold Don in- made aware of patient's heart rates being in the 40-50s

## 2011-12-24 NOTE — Op Note (Signed)
Patient Name:           William Swanson   Date of Surgery:        12/24/2011  Pre op Diagnosis:      Left inguinal hernia  Post op Diagnosis:    Indirect left inguinal hernia  Procedure:                 Laparoscopic, preperitoneal repair left inguinal hernia with UltraPro mesh  Surgeon:                     Angelia Mould. Derrell Lolling, M.D., FACS  Assistant:                      none  Operative Indications:   William Cristal. is a 71 y.o. male. He was referred to me by Dr. Eric Form for evaluation of left groin pain, possible left inguinal hernia.  William Swanson gives a 4 week history of intermittent left inguinal pain. He doesn't really feel a bulge. He thought this was related to exercise at first but now it doesn't seem to be. He can do sit ups and walk on elliptical and doesn't really hurt him. He's had 5 episodes where the pain was moderately severe, but had no episodes in the past 10 days. He has backed off on exercise.  No change in urinary symptoms.examination revealed a small left inguinal hernia. There are no other hernias. Because of his intermittent pain he he has elected to have this repaired at this time.   Operative Findings:       I found an indirect left inguinal hernia. Otherwise the anatomy was normal.  Procedure in Detail:          Following the induction of general endotracheal anesthesia a Foley catheter was inserted, the bladder emptied, the Foley taped to the leg and the balloon deflated. The abdomen and genitalia were prepped and draped in a sterile fashion. Intravenous antibiotics were given. Surgical time out was performed. 0.5% Marcaine with epinephrine was used for local infiltration anesthetic. A transverse incision was made at the lower rim of the umbilicus. The fascia was incised transversely exposing the medial border of the left rectus muscle. A Spacemaker balloon was inserted and the left rectus sheath down to just above  the symphysis pubis in the midline. The camera was inserted  and the dissector balloon was inflated under direct vision. The deployment of the balloon went well  with normal anatomy. We could see the rectus muscles, Cooper's ligament and inferior epigastric vessels. We held the balloon in place for 5 minutes and then removed it. The trocar balloon was inflated and the trocar secured to the fascia. Pneumoperitoneum was created. We had good insufflation and visualization. A 5 mm trocar was placed in the midline below the umbilicus and I used that to clean the peritoneum off of the abdominal wall on the right lower quadrant and then inserted a 5 mm trocar in the right lower quadrant. I dissected the left inguinal area.   I   Cooper's ligamentThere was no evidence of direct hernia. I pulled the peritoneum down laterally and then dissected the areolar tissue off of the cord structures. I found a very significant indirect sac and then slowly dissected it  of the inguinal ring and pulled it back and dissected back above the level of the anterior superior iliac spine. Identify the testicular vessels and the vas deferens. There is no bleeding.  I ceck for other hernias and there were none. I inserted a 3" x 6" piece of ultra Pro mesh in position that transversely across the inguinal floor. The mesh was secured with a 5 mm protector. I placed 4 tacks along the superior rim of Cooper's ligament, to ask up the midline and across the posterior belly of the rectus muscle. Laterally I placed a few tacks, being careful to palpate the tacker through the abdominal wall to be sure that stated above the ileopubic track. Inspection revealed good deployment and smoothed positioning of the mesh in all areas and no bleeding. The long hernia sac was then brought up and tacked to the mesh laterally to prevent reherniation. There was no bleeding. The trocars were removed. The pneumoperitoneum was released. The fascia at the umbilicus was closed with 2 interrupted figure-of-eight sutures of 0 Vicryl and  the skin closed with subcuticular sutures of 4-0 Monocryl and Dermabond. Patient tolerated the procedure well without complications. EBL 10 cc. Counts correct.     Angelia Mould. Derrell Lolling, M.D., FACS General and Minimally Invasive Surgery Breast and Colorectal Surgery  12/24/2011 8:53 AM

## 2011-12-24 NOTE — Transfer of Care (Signed)
Immediate Anesthesia Transfer of Care Note  Patient: William Swanson  Procedure(s) Performed: Procedure(s) (LRB): INSERTION OF MESH (Left) LAPAROSCOPIC INGUINAL HERNIA (Left)  Patient Location: PACU  Anesthesia Type: General  Level of Consciousness: awake, sedated and patient cooperative  Airway & Oxygen Therapy: Patient Spontanous Breathing and Patient connected to face mask oxygen  Post-op Assessment: Report given to PACU RN and Post -op Vital signs reviewed and stable  Post vital signs: Reviewed and stable  Complications: No apparent anesthesia complications

## 2011-12-24 NOTE — Anesthesia Postprocedure Evaluation (Signed)
Anesthesia Post Note  Patient: William Swanson  Procedure(s) Performed: Procedure(s) (LRB): INSERTION OF MESH (Left) LAPAROSCOPIC INGUINAL HERNIA (Left)  Anesthesia type: General  Patient location: PACU  Post pain: Pain level controlled  Post assessment: Post-op Vital signs reviewed  Last Vitals: BP 109/68  Pulse 50  Temp 36.1 C  Resp 16  SpO2 99%  Post vital signs: Reviewed  Level of consciousness: sedated  Complications: No apparent anesthesia complications

## 2011-12-25 ENCOUNTER — Encounter (HOSPITAL_COMMUNITY): Payer: Self-pay | Admitting: General Surgery

## 2011-12-26 ENCOUNTER — Ambulatory Visit (INDEPENDENT_AMBULATORY_CARE_PROVIDER_SITE_OTHER): Payer: Self-pay | Admitting: General Surgery

## 2011-12-26 ENCOUNTER — Telehealth (INDEPENDENT_AMBULATORY_CARE_PROVIDER_SITE_OTHER): Payer: Self-pay | Admitting: General Surgery

## 2011-12-26 NOTE — Telephone Encounter (Signed)
Pt calling with questions about constipation.  Passed a little gas this morning for the first time.  No BM since surgery two days ago.  Taking stool softener and Miralax.  Advised to increase po fluids AMAP, take two packets of Miralax in 10oz fluid BID until having regular bowel movements and to walk AMAP as well.  Pt states he understands.

## 2011-12-29 ENCOUNTER — Telehealth: Payer: Self-pay | Admitting: Gastroenterology

## 2011-12-29 NOTE — Telephone Encounter (Signed)
I know William Swanson socially and have spoke to him a couple times about his bowel troubles since inguinal hernia repair last week.  He is having scibbolous stools with extra effort. He started miralax once daily last week, I recommended he continue once daily.  He is off narcotic pain meds (only took 1 pill post op).  He will try getting around the house more and walk outside a bit today.  Will forward to Dr. Derrell Lolling to keep him aware.

## 2012-01-10 ENCOUNTER — Encounter (INDEPENDENT_AMBULATORY_CARE_PROVIDER_SITE_OTHER): Payer: Self-pay | Admitting: General Surgery

## 2012-01-10 ENCOUNTER — Ambulatory Visit (INDEPENDENT_AMBULATORY_CARE_PROVIDER_SITE_OTHER): Payer: Medicare Other | Admitting: General Surgery

## 2012-01-10 VITALS — BP 104/58 | HR 64 | Temp 97.8°F | Resp 16 | Ht 69.0 in | Wt 170.0 lb

## 2012-01-10 DIAGNOSIS — K4090 Unilateral inguinal hernia, without obstruction or gangrene, not specified as recurrent: Secondary | ICD-10-CM

## 2012-01-10 NOTE — Progress Notes (Signed)
Patient ID: William Swanson, male   DOB: 1940-10-19, 71 y.o.   MRN: [id number]  History: [place] underwent laparoscopic repair of left inguinal hernia with mesh on 12/24/2011. The surgery was uneventful. Post op he got constipated, had hard bowel movements, had bleeding from hemorrhoids. He discussed this with Wendall Papa and Eric Form.  With adjustment in his fluid intake and fiber and MiraLAX this has now resolved. The bleeding has stopped. The hemorrhoids have settled down. Minimal rectal discomfort. He feels much better. He has no complaints about his abdominal incisions or his inguinal area.  Exam: Caley looks good. No distress. Abdomen is soft and nontender. Umbilical incision,  trocar sites have healed normally. Bilateral inguinal areas feel normal. Repair intact. nontender. No fluid collection. No ecchymoses. No sensory deficit.  Assessment: Left inguinal hernia, recovering without direct surgical complication following laparoscopic repair with mesh Postop constipation, resolved  Plan: Diet and activities discussed. Return to see me when necessary.    Angelia Mould. Derrell Lolling, M.D., [facility] General and Minimally invasive Surgery Breast and Colorectal Surgery Office:   [phone] Pager:   [phone]

## 2012-01-10 NOTE — Patient Instructions (Signed)
You are recovering from your laparoscopic inguinal hernia surgery without any obvious complications.  Be sure to stretch well before and after exercise.  Return to see Dr. Derrell Lolling if any problems arise.

## 2012-01-13 ENCOUNTER — Telehealth: Payer: Self-pay | Admitting: Gastroenterology

## 2012-01-13 ENCOUNTER — Other Ambulatory Visit (INDEPENDENT_AMBULATORY_CARE_PROVIDER_SITE_OTHER): Payer: Medicare Other

## 2012-01-13 DIAGNOSIS — R197 Diarrhea, unspecified: Secondary | ICD-10-CM

## 2012-01-13 LAB — CBC WITH DIFFERENTIAL/PLATELET
Basophils Absolute: 0 10*3/uL (ref 0.0–0.1)
Eosinophils Absolute: 0.1 10*3/uL (ref 0.0–0.7)
Lymphocytes Relative: 46.1 % — ABNORMAL HIGH (ref 12.0–46.0)
Lymphs Abs: 2.7 10*3/uL (ref 0.7–4.0)
Monocytes Relative: 8.8 % (ref 3.0–12.0)
Platelets: 278 10*3/uL (ref 150.0–400.0)
RDW: 13.6 % (ref 11.5–14.6)

## 2012-01-13 MED ORDER — METRONIDAZOLE 250 MG PO TABS: 250.0000 mg | ORAL_TABLET | Freq: Three times a day (TID) | ORAL | Status: AC

## 2012-01-13 NOTE — Telephone Encounter (Signed)
He is still having bowel troubles after lap inguinal hernia repair.  Has multiple loose stools (mushy) every morning (6-7 trips to bathroom every morning lately).  We back off his fiber (he was taking 9 fiber pills a day plus a colace every day).  Has had no colace in several days and off fiber now for two days. I'm suspicious about infection.  Patty, Can you set him up for labs this morning (cbc, bmet, stool for c. Diff pcr, fecal leuks, stool culture).  He already knows to come in.  I'm also going to start him empirically on flagyl 250 tid for 10 days with 2 refills.  He is leaving for [place] this week. He is also going to start taking a single imodium at bedtime every night (no other bowel agents).  [place], [place].

## 2012-01-13 NOTE — Telephone Encounter (Signed)
Labs have been added to EPIC pt will be in today

## 2012-01-14 ENCOUNTER — Other Ambulatory Visit: Payer: Medicare Other

## 2012-01-14 ENCOUNTER — Other Ambulatory Visit: Payer: Self-pay | Admitting: Gastroenterology

## 2012-01-14 DIAGNOSIS — R197 Diarrhea, unspecified: Secondary | ICD-10-CM

## 2012-01-14 LAB — BASIC METABOLIC PANEL
CO2: 24 mEq/L (ref 19–32)
Calcium: 9.1 mg/dL (ref 8.4–10.5)
Chloride: 106 mEq/L (ref 96–112)
Sodium: 139 mEq/L (ref 135–145)

## 2012-01-15 LAB — CLOSTRIDIUM DIFFICILE BY PCR: Toxigenic C. Difficile by PCR: NOT DETECTED

## 2012-01-18 LAB — STOOL CULTURE

## 2012-01-29 ENCOUNTER — Telehealth: Payer: Self-pay | Admitting: Gastroenterology

## 2012-01-29 DIAGNOSIS — R194 Change in bowel habit: Secondary | ICD-10-CM

## 2012-01-29 NOTE — Telephone Encounter (Signed)
Still with continued bowel issues.   William Swanson, He needs colonoscopy (either wed or Thursday morning next week at [facility], moderate sedation is OK).  For diarrhea, bowel changes.  Please contact him on cell to set it up [phone]  Thanks

## 2012-01-30 ENCOUNTER — Other Ambulatory Visit: Payer: Self-pay

## 2012-01-30 DIAGNOSIS — R194 Change in bowel habit: Secondary | ICD-10-CM

## 2012-01-30 MED ORDER — PEG-KCL-NACL-NASULF-NA ASC-C 100 G PO SOLR: 1.0000 | Freq: Once | ORAL | Status: DC

## 2012-01-30 NOTE — Telephone Encounter (Signed)
Left message on machine to call back  

## 2012-01-30 NOTE — Telephone Encounter (Signed)
02/05/12 8 am colon moderate sedation pt aware and instructed   He will call tomorrow with a fax number and I will fax him the instructions

## 2012-01-31 ENCOUNTER — Telehealth: Payer: Self-pay | Admitting: Gastroenterology

## 2012-01-31 NOTE — Telephone Encounter (Signed)
Instructions faxed to the number given

## 2012-02-03 ENCOUNTER — Telehealth: Payer: Self-pay

## 2012-02-03 ENCOUNTER — Encounter (HOSPITAL_COMMUNITY): Payer: Self-pay | Admitting: *Deleted

## 2012-02-03 NOTE — Telephone Encounter (Signed)
Message copied by Donata Duff on Mon Feb 03, 2012  9:29 AM ------      Message from: Rachael Fee      Created: Mon Feb 03, 2012  9:12 AM       845            Thanks            ----- Message -----         From: Donata Duff, CMA         Sent: 02/03/2012   8:43 AM           To: Rachael Fee, MD            The only available time is 845 am or 1015 am which would be best for you?            ----- Message -----         From: Rachael Fee, MD         Sent: 02/03/2012   8:28 AM           To: Donata Duff, CMA            Can you call patient and [facility] endo about changing his start time for colonoscopy this Wednesday to 9am instead of 8am.  I forgot about the GI tumor board Wednesday morning at [facility].            Thanks

## 2012-02-03 NOTE — Telephone Encounter (Signed)
William Swanson has changed the time as recommended

## 2012-02-03 NOTE — Telephone Encounter (Signed)
Pt has been notified of the time change

## 2012-02-03 NOTE — Telephone Encounter (Signed)
Left message on machine to call back  

## 2012-02-05 ENCOUNTER — Ambulatory Visit (HOSPITAL_COMMUNITY)
Admission: RE | Admit: 2012-02-05 | Discharge: 2012-02-05 | Disposition: A | Discharge status: Home / Self Care | Payer: Medicare Other | Source: Ambulatory Visit | Attending: Gastroenterology | Admitting: Gastroenterology

## 2012-02-05 ENCOUNTER — Encounter (HOSPITAL_COMMUNITY): Payer: Self-pay | Admitting: Gastroenterology

## 2012-02-05 ENCOUNTER — Encounter (HOSPITAL_COMMUNITY)
Admission: RE | Disposition: A | Discharge status: Home / Self Care | Payer: Self-pay | Source: Ambulatory Visit | Attending: Gastroenterology

## 2012-02-05 DIAGNOSIS — R152 Fecal urgency: Secondary | ICD-10-CM | POA: Insufficient documentation

## 2012-02-05 DIAGNOSIS — R933 Abnormal findings on diagnostic imaging of other parts of digestive tract: Secondary | ICD-10-CM

## 2012-02-05 DIAGNOSIS — R197 Diarrhea, unspecified: Secondary | ICD-10-CM

## 2012-02-05 DIAGNOSIS — R194 Change in bowel habit: Secondary | ICD-10-CM

## 2012-02-05 DIAGNOSIS — D126 Benign neoplasm of colon, unspecified: Secondary | ICD-10-CM

## 2012-02-05 DIAGNOSIS — K5730 Diverticulosis of large intestine without perforation or abscess without bleeding: Secondary | ICD-10-CM

## 2012-02-05 DIAGNOSIS — K6289 Other specified diseases of anus and rectum: Secondary | ICD-10-CM | POA: Insufficient documentation

## 2012-02-05 DIAGNOSIS — R198 Other specified symptoms and signs involving the digestive system and abdomen: Secondary | ICD-10-CM

## 2012-02-05 HISTORY — PX: COLONOSCOPY: SHX5424

## 2012-02-05 SURGERY — COLONOSCOPY
Anesthesia: Moderate Sedation

## 2012-02-05 MED ORDER — DIPHENHYDRAMINE HCL 50 MG/ML IJ SOLN
INTRAMUSCULAR | Status: DC | PRN
  Administered 2012-02-05: 25 mg via INTRAVENOUS

## 2012-02-05 MED ORDER — HYDROCORTISONE ACETATE 25 MG RE SUPP: 25.0000 mg | Freq: Every day | RECTAL | Status: DC

## 2012-02-05 MED ORDER — FENTANYL CITRATE 0.05 MG/ML IJ SOLN
INTRAMUSCULAR | Status: AC
  Filled 2012-02-05: qty 4

## 2012-02-05 MED ORDER — DIPHENHYDRAMINE HCL 50 MG/ML IJ SOLN
INTRAMUSCULAR | Status: AC
  Filled 2012-02-05: qty 1

## 2012-02-05 MED ORDER — SODIUM CHLORIDE 0.9 % IV SOLN: INTRAVENOUS | Status: DC

## 2012-02-05 MED ORDER — FENTANYL CITRATE 0.05 MG/ML IJ SOLN
INTRAMUSCULAR | Status: DC | PRN
  Administered 2012-02-05 (×2): 25 ug via INTRAVENOUS

## 2012-02-05 MED ORDER — MIDAZOLAM HCL 10 MG/2ML IJ SOLN
INTRAMUSCULAR | Status: DC | PRN
  Administered 2012-02-05 (×3): 2 mg via INTRAVENOUS

## 2012-02-05 MED ORDER — MIDAZOLAM HCL 10 MG/2ML IJ SOLN
INTRAMUSCULAR | Status: AC
  Filled 2012-02-05: qty 4

## 2012-02-05 NOTE — H&P (View-Only) (Signed)
Patient ID: William Swanson, male   DOB: 09/20/1940, 70 y.o.   MRN: [id number]  History: William Swanson underwent laparoscopic repair of left inguinal hernia with mesh on 12/24/2011. The surgery was uneventful. Post op he got constipated, had hard bowel movements, had bleeding from hemorrhoids. He discussed this with Dan Jacobs and Doug Shaw.  With adjustment in his fluid intake and fiber and MiraLAX this has now resolved. The bleeding has stopped. The hemorrhoids have settled down. Minimal rectal discomfort. He feels much better. He has no complaints about his abdominal incisions or his inguinal area.  Exam: William Swanson looks good. No distress. Abdomen is soft and nontender. Umbilical incision,  trocar sites have healed normally. Bilateral inguinal areas feel normal. Repair intact. nontender. No fluid collection. No ecchymoses. No sensory deficit.  Assessment: Left inguinal hernia, recovering without direct surgical complication following laparoscopic repair with mesh Postop constipation, resolved  Plan: Diet and activities discussed. Return to see me when necessary.    Teagan Ozawa M. Ivianna Notch, M.D., FACS [facility] General and Minimally invasive Surgery Breast and Colorectal Surgery Office:   [phone] Pager:   [phone]  

## 2012-02-05 NOTE — Op Note (Signed)
[facility] [address] [place], [address]   COLONOSCOPY PROCEDURE REPORT PATIENT: [name], [name]  MR#: [id number] BIRTHDATE: 13-Aug-1940 , 70  yrs. old GENDER: Male ENDOSCOPIST: Rachael Fee, MD PROCEDURE DATE:  02/05/2012 PROCEDURE:   Colonoscopy with biopsy and Colonoscopy with snare polypectomy ASA CLASS:   Class II INDICATIONS:diarrhea, urgency since inguinal hernia repair several weeks ago. MEDICATIONS: Fentanyl 50 mcg IV, Versed 6 mg IV, and Benadryl 25 mg IV DESCRIPTION OF PROCEDURE:   After the risks benefits and alternatives of the procedure were thoroughly explained, informed consent was obtained.  A digital rectal exam revealed no abnormalities of the rectum.   The Pentax Colonoscope [id number] endoscope was introduced through the anus and advanced to the terminal ileum which was intubated for a short distance. No adverse events experienced.   The quality of the prep was good, using MoviPrep  The instrument was then slowly withdrawn as the colon was fully examined.  COLON FINDINGS: The mucosa appeared normal in the terminal ileum. There was minor left sided diverticulosis.  There was a single 5mm sessile polyp in descending colon that was removed with cold snare, sent to pathology (jar 2).  There was very mild erythema in distal rectum, biopsies taken to check for proctitis (jar 3).  The remaining colon mucosa was completely normal, biopsied randomly to check for microsopopic inflammation.  (jar 1).  Retroflexed views revealed no abnormalities. The time to cecum=3 minutes 0 seconds. Withdrawal time=12 minutes 0 seconds.  The scope was withdrawn and the procedure completed. COMPLICATIONS: There were no complications. ENDOSCOPIC IMPRESSION: 1.   Normal mucosa in the terminal ileum 2.  There was minor left sided diverticulosis. 3.  Small polyp in descending colon; removed and sent to pathology. 4. ? Mild distal procitis, biopsied. 5. Othewrise  normal examination, random colon biopsies taken to check for microsopopic inflammation.  (jar 1). RECOMMENDATIONS: If the polyp(s) removed today are proven to be adenomatous (pre-cancerous) polyps, you will need a repeat colonoscopy in 5 years.  Otherwise you should continue to follow colorectal cancer screening guidelines for "routine risk" patients with colonoscopy in 10 years.  You will receive a letter within 1-2 weeks with the results of your biopsy as well as final recommendations.  Please call my office if you have not received a letter after 3 weeks. Await final pathology from random biopsies, distal rectum biopsies. For now, please start one suppository nightly (bedtime). This was called into your pharmacy today.  eSigned:  Rachael Fee, MD 02/05/2012 9:23 AM   cc: Eric Form, MD; Claud Kelp, MD

## 2012-02-05 NOTE — Interval H&P Note (Signed)
History and Physical Interval Note:  02/05/2012 8:08 AM  William Swanson  has presented today for surgery, with the diagnosis of Bowel habit changes [787.99]  The various methods of treatment have been discussed with the patient and family. After consideration of risks, benefits and other options for treatment, the patient has consented to  Procedure(s) (LRB) with comments: COLONOSCOPY (N/A) as a surgical intervention .  The patient's history has been reviewed, patient examined, no change in status, stable for surgery.  I have reviewed the patient's chart and labs.  Questions were answered to the patient's satisfaction.     Rob Bunting

## 2012-02-06 ENCOUNTER — Encounter (HOSPITAL_COMMUNITY): Payer: Self-pay

## 2012-02-06 ENCOUNTER — Encounter (HOSPITAL_COMMUNITY): Payer: Self-pay | Admitting: Gastroenterology

## 2012-02-13 ENCOUNTER — Other Ambulatory Visit: Payer: Self-pay

## 2012-02-13 DIAGNOSIS — R197 Diarrhea, unspecified: Secondary | ICD-10-CM

## 2012-02-13 MED ORDER — DICYCLOMINE HCL 10 MG PO CAPS: 20.0000 mg | ORAL_CAPSULE | ORAL | Status: DC

## 2012-02-14 ENCOUNTER — Telehealth: Payer: Self-pay

## 2012-02-14 ENCOUNTER — Other Ambulatory Visit: Payer: Medicare Other

## 2012-02-14 DIAGNOSIS — R197 Diarrhea, unspecified: Secondary | ICD-10-CM

## 2012-02-14 NOTE — Telephone Encounter (Signed)
Message copied by Donata Duff on Fri Feb 14, 2012  9:03 AM ------      Message from: Donata Duff      Created: Thu Feb 13, 2012  3:36 PM       Pt to get labs

## 2012-02-17 LAB — CELIAC PANEL 10
Endomysial Screen: NEGATIVE
Gliadin IgA: 10.1 U/mL (ref <20)
Gliadin IgG: 4.2 U/mL (ref <20)
IgA: 441 mg/dL — ABNORMAL HIGH (ref 68–379)
Tissue Transglut Ab: 6.2 U/mL (ref <20)

## 2012-02-17 NOTE — Telephone Encounter (Signed)
PT HAD LABS ON FRIDAY

## 2012-02-20 ENCOUNTER — Other Ambulatory Visit: Payer: Self-pay

## 2012-02-20 DIAGNOSIS — R197 Diarrhea, unspecified: Secondary | ICD-10-CM

## 2012-02-20 DIAGNOSIS — R109 Unspecified abdominal pain: Secondary | ICD-10-CM

## 2012-02-20 NOTE — Progress Notes (Signed)
  You have been scheduled for a CT scan of the abdomen and pelvis at [facility] ([address]---this is in the same building as Architectural technologist).   You are scheduled on 02/21/12 at 230 pm. You should arrive 15 minutes prior to your appointment time for registration. Please follow the written instructions below on the day of your exam:  WARNING: IF YOU ARE ALLERGIC TO IODINE/X-RAY DYE, PLEASE NOTIFY RADIOLOGY IMMEDIATELY AT [id number]! YOU WILL BE GIVEN A 13 HOUR PREMEDICATION PREP.  1) Do not eat or drink anything after 1030 am (4 hours prior to your test) 2) You have been given 2 bottles of oral contrast to drink. The solution may taste better if refrigerated, but do NOT add ice or any other liquid to this solution. Shake well before drinking.    Drink 1 bottle of contrast @ 1230 pm (2 hours prior to your exam)  Drink 1 bottle of contrast @ 1 30 pm (1 hour prior to your exam)  You may take any medications as prescribed with a small amount of water except for the following: Metformin, Glucophage, Glucovance, Avandamet, Riomet, Fortamet, Actoplus Met, Janumet, Glumetza or Metaglip. The above medications must be held the day of the exam AND 48 hours after the exam.  The purpose of you drinking the oral contrast is to aid in the visualization of your intestinal tract. The contrast solution may cause some diarrhea. Before your exam is started, you will be given a small amount of fluid to drink. Depending on your individual set of symptoms, you may also receive an intravenous injection of x-ray contrast/dye. Plan on being at [facility] for 30 minutes or long, depending on the type of exam you are having performed.  If you have any questions regarding your exam or if you need to reschedule, you may call the CT department at [phone] between the hours of 8:00 am and 5:00 pm, Monday-Friday.  ________________________________________________________________________  Pt has  been instructed and will pick up contrast and instructions from the front desk

## 2012-02-21 ENCOUNTER — Ambulatory Visit (INDEPENDENT_AMBULATORY_CARE_PROVIDER_SITE_OTHER)
Admission: RE | Admit: 2012-02-21 | Discharge: 2012-02-21 | Disposition: A | Discharge status: Home / Self Care | Payer: Medicare Other | Source: Ambulatory Visit | Attending: Gastroenterology | Admitting: Gastroenterology

## 2012-02-21 DIAGNOSIS — R109 Unspecified abdominal pain: Secondary | ICD-10-CM

## 2012-02-21 DIAGNOSIS — R197 Diarrhea, unspecified: Secondary | ICD-10-CM

## 2012-02-21 MED ORDER — IOHEXOL 300 MG/ML  SOLN
100.0000 mL | Freq: Once | INTRAMUSCULAR | Status: AC | PRN
  Administered 2012-02-21: 100 mL via INTRAVENOUS

## 2012-02-22 ENCOUNTER — Telehealth: Payer: Self-pay | Admitting: Gastroenterology

## 2012-02-22 MED ORDER — CHOLESTYRAMINE 4 G PO PACK: 1.0000 | PACK | Freq: Two times a day (BID) | ORAL | Status: DC

## 2012-02-22 NOTE — Telephone Encounter (Signed)
i discussed CT results from yesterdays scan.  Patty, can you send copy to his Urologist at [facility] urology.  He already has an appt with him in about 4 weeks, I don't think the CT finding is very signficant but urologist should know about it.  I called him in cholestryamine, he will take twice daily (in addition to bentyl, imodium) and will call me early mid next week.

## 2012-02-24 NOTE — Telephone Encounter (Signed)
Result has been sent to [facility]

## 2012-03-03 ENCOUNTER — Telehealth: Payer: Self-pay | Admitting: Gastroenterology

## 2012-03-03 NOTE — Telephone Encounter (Signed)
Pt added to Dr Christella Hartigan schedule

## 2012-03-03 NOTE — Telephone Encounter (Signed)
Patty, can you call him on cell.  Hoping to get him rov tomorrow, late AM.

## 2012-03-04 ENCOUNTER — Ambulatory Visit (INDEPENDENT_AMBULATORY_CARE_PROVIDER_SITE_OTHER): Payer: Medicare Other | Admitting: Gastroenterology

## 2012-03-04 ENCOUNTER — Encounter: Payer: Self-pay | Admitting: Gastroenterology

## 2012-03-04 ENCOUNTER — Telehealth (INDEPENDENT_AMBULATORY_CARE_PROVIDER_SITE_OTHER): Payer: Self-pay | Admitting: General Surgery

## 2012-03-04 VITALS — BP 102/64 | HR 60 | Ht 69.0 in | Wt 171.8 lb

## 2012-03-04 DIAGNOSIS — K6289 Other specified diseases of anus and rectum: Secondary | ICD-10-CM

## 2012-03-04 DIAGNOSIS — R198 Other specified symptoms and signs involving the digestive system and abdomen: Secondary | ICD-10-CM

## 2012-03-04 MED ORDER — HYOSCYAMINE SULFATE 0.125 MG SL SUBL: 0.2500 mg | SUBLINGUAL_TABLET | Freq: Two times a day (BID) | SUBLINGUAL | Status: DC | PRN

## 2012-03-04 NOTE — Progress Notes (Signed)
HPI: This is a  very pleasant 71 year old man who is been struggling with his bowels since inguinal surgery or 2-1/2 months ago.  I empirically try him on Flagyl without change. Her pulses stool up with fiber without change. We tried Imodium without significant change. MiraLax without significant change. Perform colonoscopy last month and saw mild erythema in his rectum however biopsies ruled out any inflammation. He did have a small polyp for which he will require repeat colonoscopy at usual interval. Random biopsies of the colon rule out microscopic colitis. Lately I have had him on twice daily and he spasmodics. I have also tried him on rectal suppository.      Currently He is taking 2 bentyl every AM, 1 bentyl in PM.  He restarted suppositories 2-3 days ago.    Symptoms are all rectal.  Spasms.  Prior to inguinal surgery he never had problems like this.  July had a very hard solid stool.    Hernia surgery sep 3rd.    The pattern is a normal, large soft formed BM; followed by 1-2 more similar stools in AM.    Then has urgency, spasm, mushy stools, occasional blood in toilet.  Using Tux.   Past Medical History  Diagnosis Date  . BPH (benign prostatic hyperplasia)     mild  . Frequency of urination   . Hyperlipemia   . No pertinent past medical history   . Glaucoma     suspect  . Claustrophobia   . HSV-1 (herpes simplex virus 1) infection   . Osteopenia   . Weight loss   . OAB (overactive bladder)   . Spinal stenosis   . Bilateral carpal tunnel syndrome   . Humerus fracture   . Morton's neuroma   . Left groin pain     Past Surgical History  Procedure Date  . Colonoscopy   . Foot neuroma surgery yrs ago    lt foot  . Lumbar epidural injection yrs ago  . Carpal tunnel release 07/23/2011, left    Procedure: CARPAL TUNNEL RELEASE;  Surgeon: Nicki Reaper, MD;  Location: [facility];  Service: Orthopedics;  Laterality: Left;  . Tonsillectomy as child  .  Inguinal hernia repair 12/24/2011    Procedure: LAPAROSCOPIC INGUINAL HERNIA;  Surgeon: Ernestene Mention, MD;  Location: [facility];  Service: General;  Laterality: Left;  . Colonoscopy 02/05/2012    Procedure: COLONOSCOPY;  Surgeon: Rachael Fee, MD;  Location: [facility];  Service: Endoscopy;  Laterality: N/A;    Current Outpatient Prescriptions  Medication Sig Dispense Refill  . alfuzosin (UROXATRAL) 10 MG 24 hr tablet Take 10 mg by mouth every morning.       Marland Kitchen aspirin 81 MG tablet Take 81 mg by mouth daily.      Marland Kitchen atorvastatin (LIPITOR) 20 MG tablet Take 20 mg by mouth daily before breakfast.       . Calcium Carbonate-Vitamin D (CALTRATE 600+D PO) Take 1 tablet by mouth 2 (two) times daily.       . cholecalciferol (VITAMIN D) 1000 UNITS tablet Take 1,000 Units by mouth daily.      . cholestyramine (QUESTRAN) 4 G packet Take 1 packet by mouth 2 (two) times daily with a meal.  60 each  3  . diazepam (VALIUM) 5 MG tablet Take 5 mg by mouth every 8 (eight) hours as needed. anxiety      . dicyclomine (BENTYL) 10 MG capsule Take 2 capsules (20 mg total) by  mouth as directed. 1 every morning and 1 every night  60 capsule  2  . EPINEPHrine (EPIPEN 2-PAK) 0.3 mg/0.3 mL DEVI Inject 0.3 mg into the muscle as needed.      . finasteride (PROSCAR) 5 MG tablet Take 5 mg by mouth every morning.       . fish oil-omega-3 fatty acids 1000 MG capsule Take 1 g by mouth daily.       . hydrocortisone (ANUSOL-HC) 25 MG suppository Place 1 suppository (25 mg total) rectally at bedtime.  30 suppository  3  . Multiple Vitamin (MULTIVITAMIN) tablet Take 1 tablet by mouth daily.      . Timolol Maleate (ISTALOL) 0.5 % (DAILY) SOLN Place 1 drop into both eyes every morning.       . valACYclovir (VALTREX) 1000 MG tablet Take 1,000 mg by mouth as needed.      . vitamin C (ASCORBIC ACID) 500 MG tablet Take 500 mg by mouth daily.      . zaleplon (SONATA) 10 MG capsule Take 10 mg by mouth at bedtime as needed.         Allergies as of 03/04/2012  . (No Known Allergies)    Family History  Problem Relation Age of Onset  . Heart disease Father   . Cancer Mother     ovarian  . Nephrolithiasis Brother     History   Social History  . Marital Status: Married    Spouse Name: N/A    Number of Children: N/A  . Years of Education: N/A   Occupational History  . Not on file.   Social History Main Topics  . Smoking status: Former Smoker -- 1.0 packs/day for 15 years    Types: Cigarettes    Quit date: 07/17/1975  . Smokeless tobacco: Never Used  . Alcohol Use: 8.4 oz/week    14 Glasses of wine per week     Comment: daily wine 2 glasses  . Drug Use: No  . Sexually Active: Not on file   Other Topics Concern  . Not on file   Social History Narrative  . No narrative on file      Physical Exam: BP 102/64  Pulse 60  Ht 5\' 9"  (1.753 m)  Wt 171 lb 12.8 oz (77.928 kg)  BMI 25.37 kg/m2 Constitutional: generally well-appearing Psychiatric: alert and oriented x3 Abdomen: soft, nontender, nondistended, no obvious ascites, no peritoneal signs, normal bowel sounds Rectal exam: Slightly excoriated anal tissue, digital examination found mildly tender mucosa circumferentially, there was no blood in stool.    Assessment and plan: 71 y.o. male with persistent bowel difficulty, changes since inguinal hernia repair two and a half months ago.  He said his bowel changes or slightly better today than they were yesterday and yesterday was slightly better than the day prior. Perhaps he is starting to pick up out of this likely IBS related problem. He is clearly focused on his bowel changes and is an anxious person but the tenderness Ms. and urgency he describes is a bit more than usual for simple IBS. He is very reasonable and I tried multiple different compared therapies without much improvement. I'm going to get him back into see his general surgeon to discuss his symptoms, see if he has had any prior  experience with similar symptoms, cases. For now I am changing his antispasmodic from twice daily pills to a sublingual immediate release formulation which she will take after his second normal bowel movement of the  morning to try to help prevent urgency, spasms sensation. We will continue on hydrocortisone suppository nightly. He will call me in 3-4 days reported symptoms as well.

## 2012-03-04 NOTE — Patient Instructions (Addendum)
We will help get you back in to see Dr. Derrell Lolling.  Your appointment is 03/23/12 130 pm please arrive at 115 pm.  You have been put on a wait list and will be called if an appointment becomes available sooner.  [phone] Continue suppository nightly. Stop the twice daily antispasm. But start 2 sublingual pills immediately after your second "normal" BM in AM. Call Dr. Christella Hartigan in 3-5 days to report on your symptoms.

## 2012-03-04 NOTE — Telephone Encounter (Signed)
Called patient and offered for him to be seen tomorrow 03/05/12 at 8:00 am. Patient unable to come in at that time. Friday clinic is full and unable to be added to beginning of clinic due to surgery schedule. Patient will keep the appointment on 03/23/12, and we will look out for any cancellations and ad him into the schedule at that time. Patient agreed.

## 2012-03-12 ENCOUNTER — Encounter (INDEPENDENT_AMBULATORY_CARE_PROVIDER_SITE_OTHER): Payer: Self-pay | Admitting: General Surgery

## 2012-03-12 ENCOUNTER — Ambulatory Visit (INDEPENDENT_AMBULATORY_CARE_PROVIDER_SITE_OTHER): Payer: Medicare Other | Admitting: General Surgery

## 2012-03-12 VITALS — BP 112/66 | HR 72 | Temp 97.4°F | Resp 16 | Ht 69.0 in | Wt 169.1 lb

## 2012-03-12 DIAGNOSIS — K648 Other hemorrhoids: Secondary | ICD-10-CM

## 2012-03-12 DIAGNOSIS — K602 Anal fissure, unspecified: Secondary | ICD-10-CM

## 2012-03-12 NOTE — Patient Instructions (Signed)
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 You also have some internal hemorrhoids which can sometimes give  a sensation of pressure, fullness, and can also cause bleeding.  The next that we are going to do is to treat you for anal fissure with Cardizem cream 4 times a day. As discussed you may continue the other suppositories.  Give me a call or make an appointment if you are not significantly better in 3 weeks. We could consider injection therapy of her hemorrhoids at that point.

## 2012-03-12 NOTE — Progress Notes (Signed)
Patient ID: William Swanson, male   DOB: [birth year], 71 y.o.   MRN: [id number] History: William Swanson returns at the request of Dr. Wendall Papa for evaluation of GI and rectal symptoms. William Swanson underwent an uneventful laparoscopic repair of left inguinal hernia with mesh on 12/24/2011. Postop he get constipated. Had some bleeding. This resolved with MiraLAX. Since that time he's had some alternating diarrhea and constipation. No real abdominal pain or cramps. He had a colonoscopy on 02/05/2012 which was not very remarkable. He had one polyp but biopsies were negative for inflammation. Dr. Christella Hartigan has tried a variety of treatments including Flagyl, Imodium, MiraLAX, suppositories, anti-spasmodic. Lots of blood tests, checked for celiac disease, etc. He is still taking Anusol HC Suppository's and bentyl as an anti-spasmodic. His symptoms continued. He says that in the morning he feels fine, and then when he gets up he has one or 2 normal bowel movements. Then about an hour later he starts feeling this  urgency and tenesmus and has several tiny mushy bowel movements. He has some bright red bleeding with that although not highvolume. He denies itching or scratching. For about 4 or 5 hours he has some rectal discomfort and some bleeding and then  everything settles down and in the evening he feels fine and is asymptomatic. This all starts again in the morning.  Exam: William Swanson looks well. In no distress. Abdomen soft and nontender. Infraumbilical incision well healed. No inguinal mass. Hernia repair is intact. Rectal exam reveals anoderm is reasonably healthy. Maybe one or 2 slightly inflamed dots but he doesn't really appear to have pruritus ani.   Digital rectal exam reveals normal to increased sphincter tone. A little bit tender especially focally in the posterior  midline although I do not see a fissure. Endoscopy could be accomplished with encouragement he tolerated this although it was uncomfortable. There was no blood. He  does have internal hemorrhoids bilaterally. Again somewhat tender posteriorly but I could not actually see an open fissure. There was no sentinel pile.  Assessment: Complex rectal and proctologic symptoms. 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 Intermittent anal fissure as possible. Bleeding from internal hemorrhoids possible.  Plan: Continued bentyl and  Anusol HC Add  Cardizem cream 4 times daily for possible anti-spasmodic treatment of intermittent anal fissureI told him that if this does not help after about 3 or 4 weeks to come back and see me in the next that probably would be to inject his hemorrhoids to try to  decrease their volume and bleeding.  He was given patient booklets about hemorrhoids and anal fissure.    Angelia Mould. Derrell Lolling, M.D., [facility] General and Minimally invasive Surgery Breast and Colorectal Surgery Office:   [phone] Pager:   [phone]

## 2012-03-17 ENCOUNTER — Encounter: Payer: Self-pay | Admitting: Gastroenterology

## 2012-03-17 ENCOUNTER — Ambulatory Visit (INDEPENDENT_AMBULATORY_CARE_PROVIDER_SITE_OTHER): Payer: Medicare Other | Admitting: Gastroenterology

## 2012-03-17 VITALS — BP 110/68 | HR 52 | Ht 69.0 in | Wt 170.2 lb

## 2012-03-17 DIAGNOSIS — R198 Other specified symptoms and signs involving the digestive system and abdomen: Secondary | ICD-10-CM

## 2012-03-17 MED ORDER — DIPHENOXYLATE-ATROPINE 2.5-0.025 MG PO TABS: 1.0000 | ORAL_TABLET | Freq: Every day | ORAL | Status: DC

## 2012-03-17 NOTE — Patient Instructions (Addendum)
Continue your current regimen.  Plus add lomotil 1 pill every morning. New script called in. Contact Dr. Christella Hartigan early next week.

## 2012-03-17 NOTE — Progress Notes (Signed)
Review of pertinent gastrointestinal problems: 1. Adenomatous polyps, small, colonsocopy 01/2012; recall colonoscopy 2018 2. Urgency, tenesmus, loose stools; followed uneventful inguinal hernia repair 2013.  Symptoms essentially failed to improve with fiber supplement, cholestyramine, steroid suppository, it appeared trial of antibiotic Flagyl, antispasmodics, probiotics.  Evaluation by myself and his surgeon showed no clear anal fissure however symptoms somewhat correlated and he was put on diltiazem anal gel. Anxiety plays a role in his symptoms.  HPI: This is a   very pleasant 71 year old man whom I last saw about a month ago. I have been talking with mother phone periodically. He saw Dr. Derrell Lolling about a week ago.  Has been wearing depends, intermittently.  Has not messed them.  Today was a better day for him.  Had 3 regular BMs, then he went to exercise.  No urge during exercise.  None since.   Overall the number of issues has been decreasing.  Brought a list.  Sees dark pink bits in his stool, he does not think it is blood.  Has been more anally sore since exam at surgery office.  Lomotil recommended  Current regimine:  hyosciamine 2 pills sl after second BM, another 1-2 PRN; dilt anal gel qid; anucort 25mg  supp bid.    Past Medical History  Diagnosis Date  . BPH (benign prostatic hyperplasia)     mild  . Frequency of urination   . Hyperlipemia   . No pertinent past medical history   . Glaucoma     suspect  . Claustrophobia   . HSV-1 (herpes simplex virus 1) infection   . Osteopenia   . Weight loss   . OAB (overactive bladder)   . Spinal stenosis   . Bilateral carpal tunnel syndrome   . Humerus fracture   . Morton's neuroma   . Left groin pain     Past Surgical History  Procedure Date  . Colonoscopy   . Foot neuroma surgery yrs ago    lt foot  . Lumbar epidural injection yrs ago  . Carpal tunnel release 07/23/2011, left    Procedure: CARPAL TUNNEL RELEASE;   Surgeon: Nicki Reaper, MD;  Location: [facility];  Service: Orthopedics;  Laterality: Left;  . Tonsillectomy as child  . Inguinal hernia repair 12/24/2011    Procedure: LAPAROSCOPIC INGUINAL HERNIA;  Surgeon: Ernestene Mention, MD;  Location: [facility];  Service: General;  Laterality: Left;  . Colonoscopy 02/05/2012    Procedure: COLONOSCOPY;  Surgeon: Rachael Fee, MD;  Location: [facility];  Service: Endoscopy;  Laterality: N/A;    Current Outpatient Prescriptions  Medication Sig Dispense Refill  . alfuzosin (UROXATRAL) 10 MG 24 hr tablet Take 10 mg by mouth every morning.       Marland Kitchen aspirin 81 MG tablet Take 81 mg by mouth daily.      Marland Kitchen atorvastatin (LIPITOR) 20 MG tablet Take 20 mg by mouth daily before breakfast.       . Calcium Carbonate-Vitamin D (CALTRATE 600+D PO) Take 1 tablet by mouth 2 (two) times daily.       . cholecalciferol (VITAMIN D) 1000 UNITS tablet Take 1,000 Units by mouth daily.      . diazepam (VALIUM) 5 MG tablet Take 5 mg by mouth every 8 (eight) hours as needed. anxiety      . EPINEPHrine (EPIPEN 2-PAK) 0.3 mg/0.3 mL DEVI Inject 0.3 mg into the muscle as needed.      . finasteride (PROSCAR) 5 MG tablet Take 5  mg by mouth every morning.       . fish oil-omega-3 fatty acids 1000 MG capsule Take 1 g by mouth daily.       . hydrocortisone (ANUSOL-HC) 25 MG suppository Place 1 suppository (25 mg total) rectally at bedtime.  30 suppository  3  . hyoscyamine (LEVSIN/SL) 0.125 MG SL tablet Place 2 tablets (0.25 mg total) under the tongue 2 (two) times daily as needed for cramping.  120 tablet  6  . Multiple Vitamin (MULTIVITAMIN) tablet Take 1 tablet by mouth daily.      . Timolol Maleate (ISTALOL) 0.5 % (DAILY) SOLN Place 1 drop into both eyes every morning.       . valACYclovir (VALTREX) 1000 MG tablet Take 1,000 mg by mouth as needed.      . vitamin C (ASCORBIC ACID) 500 MG tablet Take 500 mg by mouth daily.      . zaleplon (SONATA) 10 MG capsule Take 10 mg  by mouth at bedtime as needed.        Allergies as of 03/17/2012  . (No Known Allergies)    Family History  Problem Relation Age of Onset  . Heart disease Father   . Cancer Mother     ovarian  . Nephrolithiasis Brother     History   Social History  . Marital Status: Married    Spouse Name: N/A    Number of Children: N/A  . Years of Education: N/A   Occupational History  . Not on file.   Social History Main Topics  . Smoking status: Former Smoker -- 1.0 packs/day for 15 years    Types: Cigarettes    Quit date: 07/17/1975  . Smokeless tobacco: Never Used  . Alcohol Use: 8.4 oz/week    14 Glasses of wine per week     Comment: daily wine 2 glasses  . Drug Use: No  . Sexually Active: Not on file   Other Topics Concern  . Not on file   Social History Narrative  . No narrative on file      Physical Exam: BP 110/68  Pulse 52  Ht 5\' 9"  (1.753 m)  Wt 170 lb 3.2 oz (77.202 kg)  BMI 25.13 kg/m2  SpO2 98% Constitutional: generally well-appearing Psychiatric: alert and oriented x3 Abdomen: soft, nontender, nondistended, no obvious ascites, no peritoneal signs, normal bowel sounds     Assessment and plan: 71 y.o. male with bowel difficulties for the past 2 months  Today has been a very good day, nearly normal for him actually. He had a day like this about a week ago as well. Overall I think he is very gradually improving I think that is the most important thing. I stressed to him that I think we are missing anything serious. I think he simply has no rectal irritation of some sort that started with significant constipation and diarrhea following his inguinal hernia repair. He has a friend tried Lomotil with some success and I have given him a prescription for 60 pills that he will add to his regimen.

## 2012-03-18 ENCOUNTER — Other Ambulatory Visit: Payer: Self-pay

## 2012-03-18 MED ORDER — DIPHENOXYLATE-ATROPINE 2.5-0.025 MG PO TABS: 1.0000 | ORAL_TABLET | Freq: Every day | ORAL | Status: DC

## 2012-03-23 ENCOUNTER — Encounter (INDEPENDENT_AMBULATORY_CARE_PROVIDER_SITE_OTHER): Payer: Medicare Other | Admitting: General Surgery

## 2012-03-26 ENCOUNTER — Encounter (INDEPENDENT_AMBULATORY_CARE_PROVIDER_SITE_OTHER): Payer: Medicare Other | Admitting: General Surgery

## 2012-03-30 ENCOUNTER — Telehealth: Payer: Self-pay | Admitting: Gastroenterology

## 2012-03-30 MED ORDER — ANALPRAM E 2.5-1 & 1 % RE KIT: 1.0000 "application " | PACK | Freq: Every day | RECTAL | Status: DC

## 2012-03-30 NOTE — Telephone Encounter (Signed)
Continues to slowly improve  New issue is moderate anal pain in late morning.  Current regimen: lomotil in early AM (one pill), 2 SL antispas twice a day, one hydrocort supp twice a day.  Will use

## 2012-04-28 ENCOUNTER — Other Ambulatory Visit: Payer: Self-pay | Admitting: Gastroenterology

## 2012-05-01 ENCOUNTER — Telehealth: Payer: Self-pay | Admitting: Gastroenterology

## 2012-05-01 MED ORDER — DIPHENOXYLATE-ATROPINE 2.5-0.025 MG PO TABS: 1.0000 | ORAL_TABLET | Freq: Every day | ORAL | Status: DC

## 2012-05-01 NOTE — Telephone Encounter (Signed)
Pt rx was printed and never sent to the pharmacy, I reprinted and faxed the rx per pt request

## 2012-05-05 ENCOUNTER — Encounter (INDEPENDENT_AMBULATORY_CARE_PROVIDER_SITE_OTHER): Payer: Self-pay | Admitting: General Surgery

## 2012-05-05 ENCOUNTER — Ambulatory Visit (INDEPENDENT_AMBULATORY_CARE_PROVIDER_SITE_OTHER): Payer: Medicare Other | Admitting: General Surgery

## 2012-05-05 VITALS — BP 118/64 | HR 72 | Temp 97.8°F | Resp 16 | Ht 69.0 in | Wt 172.0 lb

## 2012-05-05 DIAGNOSIS — K648 Other hemorrhoids: Secondary | ICD-10-CM | POA: Insufficient documentation

## 2012-05-05 NOTE — Patient Instructions (Signed)
Your examination today shows internal hemorrhoids, which are very likely the source of the bleeding.  You did not have hemorrhoids externally.  I injected a sclerosing solution into 3 areas of your internal hemorrhoids, and this should reduce or eliminate the  bleeding.  If you still see blood after 2 weeks, call and make an appointment to be reexamined and possibly reinjected.     Hemorrhoids Hemorrhoids are enlarged (dilated) veins around the rectum. There are 2 types of hemorrhoids, and the type of hemorrhoid is determined by its location. Internal hemorrhoids occur in the veins just inside the rectum.They are usually not painful, but they may bleed.However, they may poke through to the outside and become irritated and painful. External hemorrhoids involve the veins outside the anus and can be felt as a painful swelling or hard lump near the anus.They are often itchy and may crack and bleed. Sometimes clots will form in the veins. This makes them swollen and painful. These are called thrombosed hemorrhoids. CAUSES Causes of hemorrhoids include:  Pregnancy. This increases the pressure in the hemorrhoidal veins.  Constipation.  Straining to have a bowel movement.  Obesity.  Heavy lifting or other activity that caused you to strain. TREATMENT Most of the time hemorrhoids improve in 1 to 2 weeks. However, if symptoms do not seem to be getting better or if you have a lot of rectal bleeding, your caregiver may perform a procedure to help make the hemorrhoids get smaller or remove them completely.Possible treatments include:  Rubber band ligation. A rubber band is placed at the base of the hemorrhoid to cut off the circulation.  Sclerotherapy. A chemical is injected to shrink the hemorrhoid.  Infrared light therapy. Tools are used to burn the hemorrhoid.  Hemorrhoidectomy. This is surgical removal of the hemorrhoid. HOME CARE INSTRUCTIONS   Increase fiber in your diet. Ask your  caregiver about using fiber supplements.  Drink enough water and fluids to keep your urine clear or pale yellow.  Exercise regularly.  Go to the bathroom when you have the urge to have a bowel movement. Do not wait.  Avoid straining to have bowel movements.  Keep the anal area dry and clean.  Only take over-the-counter or prescription medicines for pain, discomfort, or fever as directed by your caregiver. If your hemorrhoids are thrombosed:  Take warm sitz baths for 20 to 30 minutes, 3 to 4 times per day.  If the hemorrhoids are very tender and swollen, place ice packs on the area as tolerated. Using ice packs between sitz baths may be helpful. Fill a plastic bag with ice. Place a towel between the bag of ice and your skin.  Medicated creams and suppositories may be used or applied as directed.  Do not use a donut-shaped pillow or sit on the toilet for long periods. This increases blood pooling and pain. SEEK MEDICAL CARE IF:   You have increasing pain and swelling that is not controlled with your medicine.  You have uncontrolled bleeding.  You have difficulty or you are unable to have a bowel movement.  You have pain or inflammation outside the area of the hemorrhoids.  You have chills or an oral temperature above 102 F (38.9 C). MAKE SURE YOU:   Understand these instructions.  Will watch your condition.  Will get help right away if you are not doing well or get worse. Document Released: 04/05/2000 Document Revised: 07/01/2011 Document Reviewed: 03/19/2010 [facility] Patient Information 2013 [place], [place].

## 2012-05-05 NOTE — Progress Notes (Signed)
Patient ID: NIKO PENSON, male   DOB: 07/14/1940, 72 y.o.   MRN: [id number] History: Tina returns to discuss his rectal problems ID. In some ways Dondi is better. He says he has a little bit less tenesmus and has no severe rectal pain. His symptoms are intermittent low volume bright red rectal bleeding. This occurs only when he has a bowel movement. He has minimal pain  during and after bowel movements but does have a little bit. No severe pain. He still has 3-4 bowel movements per day. The first one is solid and the rest are mushy.He has had an extensive workup by Dr. Rob Bunting for motility disorders, celiac disease and so forth.  Exam: looks good. In good spirits Rectal exam reveals mild pruritus ani but no external hemorrhoids. No fissure. No drainage. Digital rectal exam reveals a normal to slightly increased sphincter tone, relaxes normally, a little bit of blood on my finger. Anoscopy shows circumferential internal hemorrhoids. I did not see a fissure. I injected internal hemorrhoids in 3 separate areas with sclerosing solution. He tolerated this well  Assessment: Internal hemorrhoids with bleeding,  injected today Colonic motility disorder, suspected No evidence for fissure  Plan: Continue bowel regimen as outlined by Dr. Christella Hartigan. Return to see me if the bleeding does not completely stopp in 2-4 weeks. I told him that we might need to reinject this area or do rubberbanding. Hopefully, as the volume of the hemorrhoids gets less internally, his sensation of tenesmus will improve.   Angelia Mould. Derrell Lolling, M.D., [facility] General and Minimally invasive Surgery Breast and Colorectal Surgery Office:   [phone] Pager:   [phone]

## 2012-06-18 ENCOUNTER — Telehealth: Payer: Self-pay

## 2012-06-18 NOTE — Telephone Encounter (Signed)
Per Dr Christella Hartigan pt needs appt in the next few days Left message on machine to call back

## 2012-06-22 NOTE — Telephone Encounter (Signed)
Patient called William Swanson back to schedule appt.

## 2012-06-22 NOTE — Telephone Encounter (Signed)
Patient is scheduled for 11:30 06/23/12

## 2012-06-23 ENCOUNTER — Encounter: Payer: Self-pay | Admitting: Gastroenterology

## 2012-06-23 ENCOUNTER — Ambulatory Visit (INDEPENDENT_AMBULATORY_CARE_PROVIDER_SITE_OTHER): Payer: Medicare Other | Admitting: Gastroenterology

## 2012-06-23 VITALS — BP 118/62 | HR 60 | Ht 67.0 in | Wt 178.5 lb

## 2012-06-23 NOTE — Progress Notes (Signed)
Review of pertinent gastrointestinal problems:  1. Adenomatous polyps, small, colonsocopy 01/2012; recall colonoscopy 2018  2. Urgency, tenesmus, loose stools; followed uneventful inguinal hernia repair 2013. Symptoms essentially failed to improve with fiber supplement, cholestyramine, steroid suppository, it appeared trial of antibiotic Flagyl, antispasmodics, probiotics. Evaluation by myself and his surgeon showed no clear anal fissure however symptoms somewhat correlated and he was put on diltiazem anal gel. Anxiety plays a role in his symptoms. 3. Internal hemorrhoid, sclerosed in office by Dr. Derrell Lolling 04/2012   HPI: This is a  very pleasant 72 year old man whom I last saw in the office about 4 months ago.  Feels like he is having a 'set back'.  Good new is that it is all manageable.  Goes to BF 4-5 times (not 11 like in past).  Still has a tenesmus like sensation, bleeding more recently. Anal pain increasing, yesterday was particularly bad.   He has personal trainer once weekly.  Has groin discomfort at times, but he is concerned about hernia recurrenct.  Has dyspepsia, gurgling in his stomach.   Feels very well starting in early afternoon.  Will see blood in stool  Takes 2 lomotil every morning.  Past Medical History  Diagnosis Date  . BPH (benign prostatic hyperplasia)     mild  . Frequency of urination   . Hyperlipemia   . No pertinent past medical history   . Glaucoma     suspect  . Claustrophobia   . HSV-1 (herpes simplex virus 1) infection   . Osteopenia   . Weight loss   . OAB (overactive bladder)   . Spinal stenosis   . Bilateral carpal tunnel syndrome   . Humerus fracture   . Morton's neuroma   . Left groin pain     Past Surgical History  Procedure Laterality Date  . Colonoscopy    . Foot neuroma surgery  yrs ago    lt foot  . Lumbar epidural injection  yrs ago  . Carpal tunnel release  07/23/2011, left    Procedure: CARPAL TUNNEL RELEASE;  Surgeon: Nicki Reaper, MD;  Location: [facility];  Service: Orthopedics;  Laterality: Left;  . Tonsillectomy  as child  . Inguinal hernia repair  12/24/2011    Procedure: LAPAROSCOPIC INGUINAL HERNIA;  Surgeon: Ernestene Mention, MD;  Location: [facility];  Service: General;  Laterality: Left;  . Colonoscopy  02/05/2012    Procedure: COLONOSCOPY;  Surgeon: Rachael Fee, MD;  Location: [facility];  Service: Endoscopy;  Laterality: N/A;    Current Outpatient Prescriptions  Medication Sig Dispense Refill  . alfuzosin (UROXATRAL) 10 MG 24 hr tablet Take 10 mg by mouth every morning.       Marland Kitchen atorvastatin (LIPITOR) 20 MG tablet Take 20 mg by mouth daily before breakfast.       . Calcium Carbonate-Vitamin D (CALTRATE 600+D PO) Take 1 tablet by mouth 2 (two) times daily.       . cholecalciferol (VITAMIN D) 1000 UNITS tablet Take 1,000 Units by mouth daily.      . diazepam (VALIUM) 5 MG tablet Take 5 mg by mouth every 8 (eight) hours as needed. anxiety      . diphenoxylate-atropine (LOMOTIL) 2.5-0.025 MG per tablet Take 1 tablet by mouth daily.  60 tablet  3  . escitalopram (LEXAPRO) 10 MG tablet 10 mg daily.       . finasteride (PROSCAR) 5 MG tablet Take 5 mg by mouth every morning.       Marland Kitchen  Hydrocortisone Ace-Pramoxine (ANALPRAM E) 2.5-1 & 1 % KIT Place 1 application rectally daily.  30 kit  3  . Multiple Vitamin (MULTIVITAMIN) tablet Take 1 tablet by mouth daily.      . Timolol Maleate (ISTALOL) 0.5 % (DAILY) SOLN Place 1 drop into both eyes every morning.       . valACYclovir (VALTREX) 1000 MG tablet Take 1,000 mg by mouth as needed.      . vitamin C (ASCORBIC ACID) 500 MG tablet Take 500 mg by mouth daily.      . zaleplon (SONATA) 10 MG capsule Take 10 mg by mouth at bedtime as needed.      Marland Kitchen aspirin 81 MG tablet Take 81 mg by mouth daily.      Marland Kitchen EPINEPHrine (EPIPEN 2-PAK) 0.3 mg/0.3 mL DEVI Inject 0.3 mg into the muscle as needed.      . fish oil-omega-3 fatty acids 1000 MG capsule Take 1 g by  mouth daily.        No current facility-administered medications for this visit.    Allergies as of 06/23/2012  . (No Known Allergies)    Family History  Problem Relation Age of Onset  . Heart disease Father   . Cancer Mother     ovarian  . Nephrolithiasis Brother     History   Social History  . Marital Status: Married    Spouse Name: N/A    Number of Children: N/A  . Years of Education: N/A   Occupational History  . Not on file.   Social History Main Topics  . Smoking status: Former Smoker -- 1.00 packs/day for 15 years    Types: Cigarettes    Quit date: 07/17/1975  . Smokeless tobacco: Never Used  . Alcohol Use: 8.4 oz/week    14 Glasses of wine per week     Comment: daily wine 2 glasses  . Drug Use: No  . Sexually Active: Not on file   Other Topics Concern  . Not on file   Social History Narrative  . 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 Injection, sclerosant of internal hemorrhoid columns 6 weeks ago has not helped. Still puzzled by the causes difficulty. He does have some hemorrhoidal tissue with some Reglan site but none of this hemorrhoid is thrombosed. There might be a small underlying fissure. He is interested in meeting with Dr. Maisie Fus who is a new Careers adviser at [facility] surgery who has particular expertise in interest in anorectal disease.   In the meantime he is going to resume Analpram-type ointments twice daily. He'll take Lomotil 3 pills every morning as well.

## 2012-06-23 NOTE — Patient Instructions (Addendum)
Take lomotil 3 pills once every morning. Resume your analpram ointment twice daily daily for your anal discomfort, pain. Stop the align. Referral to William Levee, MD at [facility] who has particular expertise, interest in colon/anorectal disorders.  Please show up to [facility] office at 8:15 on 06/30/12                                              We are excited to introduce MyChart, a new best-in-class service that provides you online access to important information in your electronic medical record. We want to make it easier for you to view your health information - all in one secure location - when and where you need it. We expect MyChart will enhance the quality of care and service we provide.  When you register for MyChart, you can:    View your test results.    Request appointments and receive appointment reminders via email.    Request medication renewals.    View your medical history, allergies, medications and immunizations.    Communicate with your physician's office through a password-protected site.    Conveniently print information such as your medication lists.  To find out if MyChart is right for you, please talk to a member of our clinical staff today. We will gladly answer your questions about this free health and wellness tool.  If you are age 89 or older and want a member of your family to have access to your record, you must provide written consent by completing a proxy form available at our office. Please speak to our clinical staff about guidelines regarding accounts for patients younger than age 82.  As you activate your MyChart account and need any technical assistance, please call the MyChart technical support line at [phone]) [phone] [phone]) or email your question to [email or web address]. If you email your question(s), please include your name, a return phone number and the best time to reach you.  If you have non-urgent health-related questions, you can send a  message to our office through MyChart at [email or web address]. If you have a medical emergency, call 911.  Thank you for using MyChart as your new health and wellness resource!   MyChart licensed from [employer],  [id number]. Patents Pending.

## 2012-06-30 ENCOUNTER — Ambulatory Visit (INDEPENDENT_AMBULATORY_CARE_PROVIDER_SITE_OTHER): Payer: Medicare Other | Admitting: General Surgery

## 2012-06-30 VITALS — BP 110/64 | HR 60 | Resp 18 | Ht 68.0 in | Wt 180.6 lb

## 2012-06-30 MED ORDER — HYDROCORTISONE ACETATE 25 MG RE SUPP: 25.0000 mg | Freq: Two times a day (BID) | RECTAL | Status: DC

## 2012-06-30 MED ORDER — HYDROCORTISONE ACETATE 25 MG RE SUPP: 25.0000 mg | Freq: Every day | RECTAL | Status: DC

## 2012-06-30 NOTE — Patient Instructions (Signed)
HEMORRHOIDS    Did you know... Hemorrhoids are one of the most common ailments known.  More than half the population will develop hemorrhoids, usually after age 74.  Millions of Americans currently suffer from hemorrhoids.  The average person suffers in silence for a long period before seeking medical care.  Today's treatment methods make some types of hemorrhoid removal much less painful.  What are hemorrhoids? Often described as "varicose veins of the anus and rectum", hemorrhoids are enlarged, bulging blood vessels in and about the anus and lower rectum. There are two types of hemorrhoids: external and internal, which refer to their location.  External (outside) hemorrhoids develop near the anus and are covered by very sensitive skin. These are usually painless. However, if a blood clot (thrombosis) develops in an external hemorrhoid, it becomes a painful, hard lump. The external hemorrhoid may bleed if it ruptures. Internal (inside) hemorrhoids develop within the anus beneath the lining. Painless bleeding and protrusion during bowel movements are the most common symptom. However, an internal hemorrhoid can cause severe pain if it is completely "prolapsed" - protrudes from the anal opening and cannot be pushed back inside.   What causes hemorrhoids? An exact cause is unknown; however, the upright posture of humans alone forces a great deal of pressure on the rectal veins, which sometimes causes them to bulge. Other contributing factors include:  . Aging  . Chronic constipation or diarrhea  . Pregnancy  . Heredity  . Straining during bowel movements  . Faulty bowel function due to overuse of laxatives or enemas . Spending long periods of time (e.g., reading) on the toilet  Whatever the cause, the tissues supporting the vessels stretch. As a result, the vessels dilate; their walls become thin and bleed. If the stretching and pressure continue, the weakened vessels protrude.  What are the  symptoms? If you notice any of the following, you could have hemorrhoids:  . Bleeding during bowel movements  . Protrusion during bowel movements . Itching in the anal area  . Pain  . Sensitive lump(s)  How are hemorrhoids treated? Mild symptoms can be relieved frequently by increasing the amount of fiber (e.g., fruits, vegetables, breads and cereals) and fluids in the diet. Eliminating excessive straining reduces the pressure on hemorrhoids and helps prevent them from protruding. A sitz bath - sitting in plain warm water for about 10 minutes - can also provide some relief . With these measures, the pain and swelling of most symptomatic hemorrhoids will decrease in two to seven days, and the firm lump should recede within four to six weeks. In cases of severe or persistent pain from a thrombosed hemorrhoid, your physician may elect to remove the hemorrhoid containing the clot with a small incision. Performed under local anesthesia as an outpatient, this procedure generally provides relief. Severe hemorrhoids may require special treatment, much of which can be performed on an outpatient basis.  . Ligation - the rubber band treatment - works effectively on internal hemorrhoids that protrude with bowel movements. A small rubber band is placed over the hemorrhoid, cutting off its blood supply. The hemorrhoid and the band fall off in a few days and the wound usually heals in a week or two. This procedure sometimes produces mild discomfort and bleeding and may need to be repeated for a full effect.  There is a more intense version of this procedure that is done in the OR as outpatient surgery called THD.  It involves identifying blood vessels leading to the   hemorrhoids and then tying them off with sutures.  This method is a little more painful than rubber band ligation but less painful than traditional hemorrhoidectomy and usually does not have to be repeated.  It is best for internal hemorrhoids that  bleed.  Rubber Band Ligation of Internal Hemorrhoids:  A.  Bulging, bleeding, internal hemorrhoid B.  Rubber band applied at the base of the hemorrhoid C.  About 7 days later, the banded hemorrhoid has fallen off leaving a small scar (arrow)  . Injection and Coagulation can also be used on bleeding hemorrhoids that do not protrude. Both methods are relatively painless and cause the hemorrhoid to shrivel up. Marland Kitchen Hemorrhoidectomy - surgery to remove the hemorrhoids - is the most complete method for removal of internal and external hemorrhoids. It is necessary when (1) clots repeatedly form in external hemorrhoids; (2) ligation fails to treat internal hemorrhoids; (3) the protruding hemorrhoid cannot be reduced; or (4) there is persistent bleeding. A hemorrhoidectomy removes excessive tissue that causes the bleeding and protrusion. It is done under anesthesia using sutures, and may, depending upon circumstances, require hospitalization and a period of inactivity. Laser hemorrhoidectomies do not offer any advantage over standard operative techniques. They are also quite expensive, and contrary to popular belief, are no less painful.  Do hemorrhoids lead to cancer? No. There is no relationship between hemorrhoids and cancer. However, the symptoms of hemorrhoids, particularly bleeding, are similar to those of colorectal cancer and other diseases of the digestive system. Therefore, it is important that all symptoms are investigated by a physician specially trained in treating diseases of the colon and rectum and that everyone 50 years or older undergo screening tests for colorectal cancer. Do not rely on over-the-counter medications or other self-treatments. See a colorectal surgeon first so your symptoms can be properly evaluated and effective treatment prescribed.  2012 American Society of Colon & Rectal Surgeons     GETTING TO GOOD BOWEL HEALTH. Irregular bowel habits such as constipation can lead to  many problems over time.  Having one soft bowel movement a day is the most important way to prevent further problems.  The anorectal canal is designed to handle stretching and feces to safely manage our ability to get rid of solid waste (feces, poop, stool) out of our body.  BUT, hard constipated stools can act like ripping concrete bricks causing inflamed hemorrhoids, anal fissures, abdominal pain and bloating.     The goal: ONE SOFT BOWEL MOVEMENT A DAY!  To have soft, regular bowel movements:    Drink at least 8 tall glasses of water a day.     Take plenty of fiber.  Fiber is the undigested part of plant food that passes into the colon, acting s "natures broom" to encourage bowel motility and movement.  Fiber can absorb and hold large amounts of water. This results in a larger, bulkier stool, which is soft and easier to pass. Work gradually over several weeks up to 6 servings a day of fiber (25g a day even more if needed) in the form of: o Vegetables -- Root (potatoes, carrots, turnips), leafy green (lettuce, salad greens, celery, spinach), or cooked high residue (cabbage, broccoli, etc) o Fruit -- Fresh (unpeeled skin & pulp), Dried (prunes, apricots, cherries, etc ),  or stewed ( applesauce)  o Whole grain breads, pasta, etc (whole wheat)  o Bran cereals    Bulking Agents -- This type of water-retaining fiber generally is easily obtained each day by one of  the following:  o Psyllium bran -- The psyllium plant is remarkable because its ground seeds can retain so much water. This product is available as Metamucil, Konsyl, Effersyllium, Per Diem Fiber, or the less expensive generic preparation in drug and health food stores. Although labeled a laxative, it really is not a laxative.  o Methylcellulose -- This is another fiber derived from wood which also retains water. It is available as Citrucel. o Polyethylene Glycol - and "artificial" fiber commonly called Miralax or Glycolax.  It is helpful for  people with gassy or bloated feelings with regular fiber o Flax Seed - a less gassy fiber than psyllium   No reading or other relaxing activity while on the toilet. If bowel movements take longer than 5 minutes, you are too constipated   AVOID CONSTIPATION.  High fiber and water intake usually takes care of this.  Sometimes a laxative is needed to stimulate more frequent bowel movements, but

## 2012-06-30 NOTE — Progress Notes (Signed)
No chief complaint on file.   HISTORY: William Swanson is a 72 y.o. male who presents to the office with rectal bleeding.  Other symptoms include urgency and frequent BM's.  This had been occurring for a couple months.  He states that he got constipated after hernia surgery, and when that resolved, he developed diarrhea and urgency with 10-12 BM's a day.  This improved with imodium.  He is now having bleeding with BM's but no abd cramping or blood in stool.  He has tried Analpram in the past with some recent success.  Nothing makes the symptoms worse.   It is intermittent in nature, and only occurs with BM's.  His bowel habits are regular and frequent and his bowel movements are soft to loose.  He take imodium daily.  His fiber intake is minimal.  His last colonoscopy was last fall.  No signs of inflammation were found on biopsies  He denies prolapsing tissue.     Past Medical History  Diagnosis Date  . BPH (benign prostatic hyperplasia)     mild  . Frequency of urination   . Hyperlipemia   . No pertinent past medical history   . Glaucoma     suspect  . Claustrophobia   . HSV-1 (herpes simplex virus 1) infection   . Osteopenia   . Weight loss   . OAB (overactive bladder)   . Spinal stenosis   . Bilateral carpal tunnel syndrome   . Humerus fracture   . Morton's neuroma   . Left groin pain       Past Surgical History  Procedure Laterality Date  . Colonoscopy    . Foot neuroma surgery  yrs ago    lt foot  . Lumbar epidural injection  yrs ago  . Carpal tunnel release  07/23/2011, left    Procedure: CARPAL TUNNEL RELEASE;  Surgeon: Nicki Reaper, MD;  Location: [facility];  Service: Orthopedics;  Laterality: Left;  . Tonsillectomy  as child  . Inguinal hernia repair  12/24/2011    Procedure: LAPAROSCOPIC INGUINAL HERNIA;  Surgeon: Ernestene Mention, MD;  Location: [facility];  Service: General;  Laterality: Left;  . Colonoscopy  02/05/2012    Procedure: COLONOSCOPY;  Surgeon:  Rachael Fee, MD;  Location: [facility];  Service: Endoscopy;  Laterality: N/A;        Current Outpatient Prescriptions  Medication Sig Dispense Refill  . alfuzosin (UROXATRAL) 10 MG 24 hr tablet Take 10 mg by mouth every morning.       Marland Kitchen aspirin 81 MG tablet Take 81 mg by mouth daily.      Marland Kitchen atorvastatin (LIPITOR) 20 MG tablet Take 20 mg by mouth daily before breakfast.       . Calcium Carbonate-Vitamin D (CALTRATE 600+D PO) Take 1 tablet by mouth 2 (two) times daily.       . cholecalciferol (VITAMIN D) 1000 UNITS tablet Take 1,000 Units by mouth daily.      . diazepam (VALIUM) 5 MG tablet Take 5 mg by mouth every 8 (eight) hours as needed. anxiety      . diphenoxylate-atropine (LOMOTIL) 2.5-0.025 MG per tablet Take 1 tablet by mouth daily.  60 tablet  3  . EPINEPHrine (EPIPEN 2-PAK) 0.3 mg/0.3 mL DEVI Inject 0.3 mg into the muscle as needed.      Marland Kitchen escitalopram (LEXAPRO) 10 MG tablet 10 mg daily.       . finasteride (PROSCAR) 5 MG tablet Take 5  mg by mouth every morning.       . fish oil-omega-3 fatty acids 1000 MG capsule Take 1 g by mouth daily.       . Hydrocortisone Ace-Pramoxine (ANALPRAM E) 2.5-1 & 1 % KIT Place 1 application rectally daily.  30 kit  3  . Multiple Vitamin (MULTIVITAMIN) tablet Take 1 tablet by mouth daily.      . Timolol Maleate (ISTALOL) 0.5 % (DAILY) SOLN Place 1 drop into both eyes every morning.       . valACYclovir (VALTREX) 1000 MG tablet Take 1,000 mg by mouth as needed.      . vitamin C (ASCORBIC ACID) 500 MG tablet Take 500 mg by mouth daily.      . zaleplon (SONATA) 10 MG capsule Take 10 mg by mouth at bedtime as needed.      . hydrocortisone (ANUSOL-HC) 25 MG suppository Place 1 suppository (25 mg total) rectally 2 (two) times daily.  12 suppository  0   No current facility-administered medications for this visit.      No Known Allergies    Family History  Problem Relation Age of Onset  . Heart disease Father   . Cancer Mother     ovarian  .  Nephrolithiasis Brother     History   Social History  . Marital Status: Married    Spouse Name: N/A    Number of Children: N/A  . Years of Education: N/A   Social History Main Topics  . Smoking status: Former Smoker -- 1.00 packs/day for 15 years    Types: Cigarettes    Quit date: 07/17/1975  . Smokeless tobacco: Never Used  . Alcohol Use: 8.4 oz/week    14 Glasses of wine per week     Comment: daily wine 2 glasses  . Drug Use: No  . Sexually Active: Not on file   Other Topics Concern  . Not on file   Social History Narrative  . No narrative on file      REVIEW OF SYSTEMS - PERTINENT POSITIVES ONLY: Review of Systems - General ROS: negative for - chills, fever or weight loss Hematological and Lymphatic ROS: negative for - bleeding problems, blood clots or bruising Respiratory ROS: no cough, shortness of breath, or wheezing Cardiovascular ROS: no chest pain or dyspnea on exertion Gastrointestinal ROS: positive for - change in bowel habits negative for - melena, nausea/vomiting or stool incontinence Genito-Urinary ROS: no dysuria, trouble voiding, or hematuria  EXAM: Filed Vitals:   06/30/12 0841  BP: 110/64  Pulse: 60  Resp: 18    General appearance: alert and cooperative Resp: clear to auscultation bilaterally Cardio: regular rate and rhythm GI: soft, non-tender; bowel sounds normal; no masses,  no organomegaly   Procedure: Anoscopy Surgeon: Maisie Fus Diagnosis: rectal bleeding  Assistant: Morris After the risks and benefits were explained, verbal consent was obtained for above procedure  Anesthesia: none Findings: inflamed, grade 2 hemorrhoids, R posterior was the largest    ASSESSMENT AND PLAN: William Swanson is a 72 y.o. M who has a history of frequent and bloody BM's.   This started after an episode of constipation.  It appears to me on exam that he has inflamed, grade 2 hemorrhoids.  We discussed treatment of this, which includes adding a suppository  and increasing his fiber intake.  I told him that the suppository and alalpram would help calm down his symptoms, but the fiber would help keep them from coming back.  He is  to use the suppository in the evenings and the cream in the AM after BM's.  He should stop the suppository after 7 days.  I will see him back in 4 weeks.  If he continues to have bleeding, we will try banding in the office.       Vanita Panda, MD Colon and Rectal Surgery / General Surgery [facility]      Visit Diagnoses: 1. Internal bleeding hemorrhoids     Primary Care Physician: Kari Baars, MD

## 2012-07-28 ENCOUNTER — Telehealth (INDEPENDENT_AMBULATORY_CARE_PROVIDER_SITE_OTHER): Payer: Self-pay

## 2012-07-28 ENCOUNTER — Other Ambulatory Visit: Payer: Self-pay | Admitting: Gastroenterology

## 2012-07-28 ENCOUNTER — Ambulatory Visit (INDEPENDENT_AMBULATORY_CARE_PROVIDER_SITE_OTHER): Payer: Medicare Other | Admitting: General Surgery

## 2012-07-28 ENCOUNTER — Encounter (INDEPENDENT_AMBULATORY_CARE_PROVIDER_SITE_OTHER): Payer: Self-pay | Admitting: General Surgery

## 2012-07-28 VITALS — BP 124/78 | HR 56 | Temp 97.2°F | Resp 16 | Ht 68.0 in | Wt 180.4 lb

## 2012-07-28 DIAGNOSIS — K648 Other hemorrhoids: Secondary | ICD-10-CM

## 2012-07-28 NOTE — Telephone Encounter (Signed)
I called and scheduled the pt for a sooner appointment per his request.  Dr Maisie Fus is not in the office in 3 weeks

## 2012-07-28 NOTE — Patient Instructions (Addendum)
Patient Information following hemorrhoid banding  Hemorrhoid banding is a procedure that places a small rubber band around a hemorrhoid, causing it to clot and then break off and be passed in the stool.  This is a minor procedure, but problems may develop in rare cases.  The following are warning symptoms and signs that should alert you to a possible complication.  Please contact the office if any of these should occur:   Increased pain with bowel movements or sitting  Temperature over 100.4 F (oral)  Bleeding that is excessive (over one cup of clots or blood)  Redness or irritation outside the anus  Difficulty urinating  For your comfort please follow these instructions:   Maintain a high fiber diet so that your bowel movements will be soft  Take a fiber supplement twice a day (such as Metamucil, Benefiber, Citracel or Fibercon)  Sit in a tub of warm water 2-3 times a day for the first 2-3 days, as needed to soothe the area.  You may expect some pressure sensations in the anal area for 1-2 days  If you need pain medication, take Tylenol not aspirin or Ibuprofen products.  In 7-10 days, the banded tissue and rubber band will pass with your stool.  Occasionally there is some bleeding after this.  If this bleeding seems excessive, call the office immediately or go to the Emergency Room.    Make an appointment to see me in 2-3 weeks after the procedure 

## 2012-07-28 NOTE — Progress Notes (Signed)
William Swanson is a 72 y.o. male who is here for a follow up visit regarding his hemorrhoids.  He did not see much relief with the steroid suppositories.  He is still having several BM's a day.  The first BM is associated with minimal pain and bleeding but this gets worse as the day goes on.  Objective: Filed Vitals:   07/28/12 1026  BP: 124/78  Pulse: 56  Temp: 97.2 F (36.2 C)  Resp: 16    General appearance: alert and cooperative GI: soft, non-tender; bowel sounds normal; no masses,  no organomegaly  Procedure: Hemorrhoid banding Surgeon: Maisie Fus Assistant: Christy After the risks and benefits were explained, verbal consent was obtained for above procedure  Anesthesia: none Diagnosis: bleeding internal hemorrhoids Findings: bleeding grade 2 hemorrhoids, lateral perianal skin irritation, slight anal canal hypertesion  The anatomy & physiology of the anorectal region was discussed.  The pathophysiology of hemorrhoids and differential diagnosis was discussed.  Natural history progression  was discussed.   I stressed the importance of a bowel regimen to have daily soft bowel movements to minimize progression of disease.     The patient's symptoms are not adequately controlled.  Therefore, I recommended banding to treat the hemorrhoids.  I went over the technique, risks, benefits, and alternatives.   Goals of post-operative recovery were discussed as well.  Questions were answered.  The patient expressed understanding & wished to proceed.  The patient was positioned in the prone position on the proctology table.  Perianal & rectal examination was done.  Using anoscopy, I ligated the left lateral and right posterior hemorrhoids above the dentate line with banding.  The patient tolerated the procedure well.  Educational handouts further explaining the pathology, treatment options, and bowel regimen were given as well.   Assessment and Plan: William Swanson is a 72 y.o. with several anorectal  complaints.  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  I think that his inflamed hemorrhoids are causing these symptoms.  His syptoms have not gotten better with medical treatment, therefore I have decided to try banding to hel pwith his hemorrhoid symptoms.  We will continue the analpram cream but not do the suppositories for now. We discussed toileting habits as he did have some skin breakdown on exam.  He will try changing around his lomotil to see if that decreases his BM's.  He will try some sitz baths, especially after BM's, to help with the anal pain and irritation.  I will see him back in 3 weeks.   William Panda, MD [facility], [place] [phone]

## 2012-07-28 NOTE — Telephone Encounter (Signed)
Message copied by Ivory Broad on Tue Jul 28, 2012  4:41 PM ------      Message from: Grace Isaac      Created: Tue Jul 28, 2012 11:13 AM      Regarding: Dr. Maisie Fus      Contact: [phone]       Pt needs about a 3 week follow up. I have him scheduled for 5-7. He was wanting to be seen sooner. Please call him if possible.       Thanks!!! ------

## 2012-07-29 ENCOUNTER — Telehealth: Payer: Self-pay | Admitting: Gastroenterology

## 2012-07-29 MED ORDER — DIPHENOXYLATE-ATROPINE 2.5-0.025 MG PO TABS: ORAL_TABLET | ORAL | Status: DC

## 2012-07-29 NOTE — Telephone Encounter (Signed)
rx was reprinted and faxed to pharmacy

## 2012-08-12 ENCOUNTER — Encounter (INDEPENDENT_AMBULATORY_CARE_PROVIDER_SITE_OTHER): Payer: Self-pay | Admitting: General Surgery

## 2012-08-12 ENCOUNTER — Ambulatory Visit (INDEPENDENT_AMBULATORY_CARE_PROVIDER_SITE_OTHER): Payer: Medicare Other | Admitting: General Surgery

## 2012-08-12 VITALS — BP 110/62 | HR 56 | Temp 97.5°F | Resp 16 | Ht 69.0 in | Wt 177.0 lb

## 2012-08-12 DIAGNOSIS — K649 Unspecified hemorrhoids: Secondary | ICD-10-CM

## 2012-08-12 MED ORDER — HYDROCORTISONE 2.5 % RE CREA: TOPICAL_CREAM | Freq: Two times a day (BID) | RECTAL | Status: DC

## 2012-08-12 NOTE — Progress Notes (Signed)
William Swanson is a 72 y.o. male who is here for a follow up visit regarding his anal pain.  He states his diarrhea is better and his bleeding has been minimal since his banding.  He feels that his symptoms are getting better.  He is doing fiber therapy and using lomotil to help with his diarrhea.    Objective: Filed Vitals:   08/12/12 1415  BP: 110/62  Pulse: 56  Temp: 97.5 F (36.4 C)  Resp: 16    General appearance: alert and cooperative GI: soft, non-tender; bowel sounds normal; no masses,  no organomegaly   Assessment and Plan: Continue fiber.  Anusol cream prescribed to help his symptoms.  RTO in 4 weeks for anoscopy and possible repeat banding.    Vanita Panda, MD [facility], [place] [phone]

## 2012-08-12 NOTE — Patient Instructions (Signed)
Return to the office in 4 weeks.  Continue the fiber and lomotil to help with your bowel symptoms.

## 2012-08-26 ENCOUNTER — Encounter (INDEPENDENT_AMBULATORY_CARE_PROVIDER_SITE_OTHER): Payer: Medicare Other | Admitting: General Surgery

## 2012-09-10 ENCOUNTER — Encounter (INDEPENDENT_AMBULATORY_CARE_PROVIDER_SITE_OTHER): Payer: Self-pay | Admitting: General Surgery

## 2012-09-10 ENCOUNTER — Ambulatory Visit (INDEPENDENT_AMBULATORY_CARE_PROVIDER_SITE_OTHER): Payer: Medicare Other | Admitting: General Surgery

## 2012-09-10 VITALS — BP 118/68 | HR 64 | Temp 97.1°F | Resp 14 | Ht 69.0 in | Wt 180.6 lb

## 2012-09-10 DIAGNOSIS — K625 Hemorrhage of anus and rectum: Secondary | ICD-10-CM

## 2012-09-10 DIAGNOSIS — K648 Other hemorrhoids: Secondary | ICD-10-CM

## 2012-09-10 NOTE — Progress Notes (Signed)
William Swanson is a 72 y.o. male who is here for a follow up visit regarding his anal pain. He states his diarrhea is better and his bleeding has been minimal since his banding. He is having BM's 4-5 times a day and occasionally having bleeding with the later BM's.  He feels that his symptoms are getting better. He is doing fiber therapy and using lomotil to help with his diarrhea. He uses the anusol for symptoms. Objective:  Filed Vitals:   09/10/12 1647  BP: 118/68  Pulse: 64  Temp: 97.1 F (36.2 C)  Resp: 14   General appearance: alert and cooperative  GI: soft, non-tender; bowel sounds normal; no masses, no organomegaly  Assessment and Plan:  Continue fiber and Anusol cream to help symptoms. We will start a trial of anusol suppositories.  If that does not work, RTO in 3 weeks for anoscopy and possible repeat banding.   Vanita Panda, MD  [facility], [place]  [phone]

## 2012-09-10 NOTE — Patient Instructions (Addendum)
Use the suppository at night for 2 weeks straight

## 2012-09-11 ENCOUNTER — Other Ambulatory Visit (INDEPENDENT_AMBULATORY_CARE_PROVIDER_SITE_OTHER): Payer: Self-pay | Admitting: *Deleted

## 2012-09-11 DIAGNOSIS — K648 Other hemorrhoids: Secondary | ICD-10-CM

## 2012-09-11 MED ORDER — HYDROCORTISONE ACETATE 25 MG RE SUPP: 25.0000 mg | Freq: Every day | RECTAL | Status: DC

## 2012-09-29 ENCOUNTER — Encounter (INDEPENDENT_AMBULATORY_CARE_PROVIDER_SITE_OTHER): Payer: Medicare Other | Admitting: General Surgery

## 2012-10-02 ENCOUNTER — Ambulatory Visit (INDEPENDENT_AMBULATORY_CARE_PROVIDER_SITE_OTHER): Payer: Medicare Other | Admitting: General Surgery

## 2012-10-02 ENCOUNTER — Encounter (INDEPENDENT_AMBULATORY_CARE_PROVIDER_SITE_OTHER): Payer: Self-pay | Admitting: General Surgery

## 2012-10-02 VITALS — BP 116/62 | HR 68 | Temp 97.4°F | Resp 14 | Ht 69.0 in | Wt 185.2 lb

## 2012-10-02 DIAGNOSIS — K648 Other hemorrhoids: Secondary | ICD-10-CM

## 2012-10-02 NOTE — Patient Instructions (Signed)
Beginning the day after your procedure:  You may sit in a tub of warm water 2-3 times a day to relieve discomfort.  Eat a regular diet high in fiber.  Avoid foods that give you constipation or diarrhea.  Avoid foods that are difficult to digest, such as seeds, nuts, corn or popcorn.  Do not go any longer than 2 days without a bowel movement.  You may take a dose of Milk of Magnesia if you become constipated.    Drink 6-8 glasses of water daily.  Walking is encouraged.  Avoid strenuous activity and heavy lifting for 2 weeks.  You may notice some bleeding tonight and again in 7-10 days.  This is normal as long as it is not excessive (passing large clots or blood)      Call the office if you have any questions or concerns.  Call immediately if you develop:   Excessive rectal bleeding (more than a cup or passing large clots)  Increased discomfort  Fever greater than 100 F  Difficulty urinating

## 2012-10-02 NOTE — Progress Notes (Signed)
William Swanson is a 72 y.o. male who is here for a follow up visit regarding his rectal discomfort.  His bleeding is better but still occurs twice a week or so.  He is still having urgency and 3-4 BM's a day.  He states this got better temporarily with the last banding.   Objective: Filed Vitals:   10/02/12 1648  BP: 116/62  Pulse: 68  Temp: 97.4 F (36.3 C)  Resp: 14    General appearance: alert and cooperative GI: soft, non-tender; bowel sounds normal; no masses,  no organomegaly  Procedure: Anoscopy and rubber banding Surgeon: Maisie Fus Assistant: Christella Scheuermann After the risks and benefits were explained, verbal consent was obtained for above procedure  Anesthesia: none Diagnosis: rectal bleeding Findings: left lateral and right posterior inflamed hemorrhoids  Assessment and Plan:     .Vanita Panda, MD [facility], [place] [phone]

## 2012-10-25 ENCOUNTER — Other Ambulatory Visit: Payer: Self-pay | Admitting: Gastroenterology

## 2012-10-25 MED ORDER — DIPHENOXYLATE-ATROPINE 2.5-0.025 MG PO TABS: ORAL_TABLET | ORAL | Status: DC

## 2012-10-26 ENCOUNTER — Telehealth: Payer: Self-pay

## 2012-10-26 MED ORDER — DIPHENOXYLATE-ATROPINE 2.5-0.025 MG PO TABS: ORAL_TABLET | ORAL | Status: DC

## 2012-10-26 NOTE — Telephone Encounter (Signed)
rx faxed to the pharmacy and pt notified

## 2012-10-26 NOTE — Telephone Encounter (Signed)
Message copied by Donata Duff on Mon Oct 26, 2012 10:05 AM ------      Message from: Paden, Melton Alar      Created: Sun Oct 25, 2012  9:50 PM       Latajah Thuman,      I just entered new script for lomotil 90 pills, 11 refills.  I think this has to be printed.  Can you print it and then call Cephas in AM to let him know it is available for pick up.  Thanks             ------

## 2012-10-27 ENCOUNTER — Ambulatory Visit (INDEPENDENT_AMBULATORY_CARE_PROVIDER_SITE_OTHER): Payer: Medicare Other | Admitting: General Surgery

## 2012-10-27 ENCOUNTER — Encounter (INDEPENDENT_AMBULATORY_CARE_PROVIDER_SITE_OTHER): Payer: Self-pay | Admitting: General Surgery

## 2012-10-27 VITALS — BP 124/72 | HR 72 | Temp 97.1°F | Resp 16 | Ht 69.0 in | Wt 177.2 lb

## 2012-10-27 DIAGNOSIS — K6289 Other specified diseases of anus and rectum: Secondary | ICD-10-CM

## 2012-10-27 MED ORDER — MESALAMINE 1000 MG RE SUPP: 1000.0000 mg | Freq: Every day | RECTAL | Status: DC

## 2012-10-27 NOTE — Progress Notes (Addendum)
William Cadden. is a 72 y.o. male who is here for a follow up visit regarding his urgency.  He feels that things are slightly better after the banding, but he did not notice the significant improvement that he noted with the first treatment.  He is using the lomotil in the mornings, which he feels is helping him limit his BM's better.    Objective: Filed Vitals:   10/27/12 1107  BP: 124/72  Pulse: 72  Temp: 97.1 F (36.2 C)  Resp: 16    General appearance: alert and cooperative GI: soft, non-tender; bowel sounds normal; no masses,  no organomegaly   Assessment and Plan: William Ruddock. is a 72 y.o. M with rectal urgency.  This is much better than it was but not completely back to normal.  Given his continued anal inflammation, I am going to have him try Canasa suppositories for a few weeks.  He will continue to adjust his lomotil as well.  I will see him back in 1 month.     Vanita Panda, MD [facility], [place] [phone]

## 2012-10-27 NOTE — Patient Instructions (Signed)
Try the Canasa suppositories at bedtime

## 2012-10-28 ENCOUNTER — Telehealth: Payer: Self-pay

## 2012-10-28 NOTE — Telephone Encounter (Signed)
Pt needs to have appt scheduled and will be unavailable until after 11 am

## 2012-10-28 NOTE — Telephone Encounter (Signed)
Left message on machine to call back  

## 2012-10-28 NOTE — Telephone Encounter (Signed)
appt has been made for 11/16/12

## 2012-11-16 ENCOUNTER — Encounter: Payer: Self-pay | Admitting: Gastroenterology

## 2012-11-16 ENCOUNTER — Ambulatory Visit (INDEPENDENT_AMBULATORY_CARE_PROVIDER_SITE_OTHER): Payer: Medicare Other | Admitting: Gastroenterology

## 2012-11-16 VITALS — BP 80/58 | HR 68 | Ht 67.0 in | Wt 174.4 lb

## 2012-11-16 DIAGNOSIS — K6289 Other specified diseases of anus and rectum: Secondary | ICD-10-CM

## 2012-11-16 NOTE — Progress Notes (Signed)
Review of pertinent gastrointestinal problems:  1. Adenomatous polyps, small, colonsocopy 01/2012; recall colonoscopy 2018  2. Urgency, tenesmus, loose stools; followed uneventful inguinal hernia repair 2013. Symptoms essentially failed to improve with fiber supplement, cholestyramine, steroid suppository, it appeared trial of antibiotic Flagyl, antispasmodics, probiotics. Evaluation by myself and his surgeon showed no clear anal fissure however symptoms somewhat correlated and he was put on diltiazem anal gel. Anxiety plays a role in his symptoms.  3. Internal hemorrhoid, sclerosed in office by Dr. Derrell Lolling 04/2012.  Banding procedure twice by Dr. Maisie Fus   HPI: This is a  very pleasant 72 year old man whom I last saw in the office several months ago.  Two banding procedures with Dr. Maisie Fus.  First was helpful, second not quite so helpful. Was given canasa suppositories, these did not help.  In past 3 weeks, overall much better.  First BM of the day is large, formed, non-bloody. Then another BM, smaller within 1/2 hour to hour later.  NO more after that.  Has rumbling, a bit gassier than usual.  Lately taking lomotil, takes 2 of them first thing in AM.  Last week taking 4 per day.  Has a bit of discomfort, anally.  But this is also improving.     Past Medical History  Diagnosis Date  . BPH (benign prostatic hyperplasia)     mild  . Frequency of urination   . Hyperlipemia   . No pertinent past medical history   . Glaucoma     suspect  . Claustrophobia   . HSV-1 (herpes simplex virus 1) infection   . Osteopenia   . Weight loss   . OAB (overactive bladder)   . Spinal stenosis   . Bilateral carpal tunnel syndrome   . Humerus fracture   . Morton's neuroma   . Left groin pain     Past Surgical History  Procedure Laterality Date  . Colonoscopy    . Foot neuroma surgery  yrs ago    lt foot  . Lumbar epidural injection  yrs ago  . Carpal tunnel release  07/23/2011, left     Procedure: CARPAL TUNNEL RELEASE;  Surgeon: Nicki Reaper, MD;  Location: [facility];  Service: Orthopedics;  Laterality: Left;  . Tonsillectomy  as child  . Inguinal hernia repair  12/24/2011    Procedure: LAPAROSCOPIC INGUINAL HERNIA;  Surgeon: Ernestene Mention, MD;  Location: [facility];  Service: General;  Laterality: Left;  . Colonoscopy  02/05/2012    Procedure: COLONOSCOPY;  Surgeon: Rachael Fee, MD;  Location: [facility];  Service: Endoscopy;  Laterality: N/A;    Current Outpatient Prescriptions  Medication Sig Dispense Refill  . alfuzosin (UROXATRAL) 10 MG 24 hr tablet Take 10 mg by mouth every morning.       Marland Kitchen ALPRAZolam (XANAX) 0.5 MG tablet       . aspirin 81 MG tablet Take 81 mg by mouth daily.      Marland Kitchen atorvastatin (LIPITOR) 20 MG tablet Take 20 mg by mouth daily before breakfast.       . Calcium Carbonate-Vitamin D (CALTRATE 600+D PO) Take 1 tablet by mouth 2 (two) times daily.       . cholecalciferol (VITAMIN D) 1000 UNITS tablet Take 1,000 Units by mouth daily.      . diazepam (VALIUM) 5 MG tablet Take 5 mg by mouth every 8 (eight) hours as needed. anxiety      . diphenoxylate-atropine (LOMOTIL) 2.5-0.025 MG per tablet take 1  tablet by mouth daily  90 tablet  11  . EPINEPHrine (EPIPEN 2-PAK) 0.3 mg/0.3 mL DEVI Inject 0.3 mg into the muscle as needed.      Marland Kitchen escitalopram (LEXAPRO) 10 MG tablet 10 mg daily.       . finasteride (PROSCAR) 5 MG tablet Take 5 mg by mouth every morning.       . fish oil-omega-3 fatty acids 1000 MG capsule Take 1 g by mouth daily.       . hydrocortisone (ANUSOL-HC) 2.5 % rectal cream Place rectally 2 (two) times daily. Apply around anus for irritated & painful hemorrhoids  15 g  2  . INTERMEZZO 3.5 MG SUBL       . Multiple Vitamin (MULTIVITAMIN) tablet Take 1 tablet by mouth daily.      . Timolol Maleate (ISTALOL) 0.5 % (DAILY) SOLN Place 1 drop into both eyes every morning.       . valACYclovir (VALTREX) 1000 MG tablet Take 1,000 mg by  mouth as needed.      . vitamin C (ASCORBIC ACID) 500 MG tablet Take 500 mg by mouth daily.      . zaleplon (SONATA) 10 MG capsule Take 10 mg by mouth at bedtime as needed.       No current facility-administered medications for this visit.    Allergies as of 11/16/2012  . (No Known Allergies)    Family History  Problem Relation Age of Onset  . Heart disease Father   . Cancer Mother     ovarian  . Nephrolithiasis Brother     History   Social History  . Marital Status: Married    Spouse Name: N/A    Number of Children: N/A  . Years of Education: N/A   Occupational History  . Not on file.   Social History Main Topics  . Smoking status: Former Smoker -- 1.00 packs/day for 15 years    Types: Cigarettes    Quit date: 07/17/1975  . Smokeless tobacco: Never Used  . Alcohol Use: 8.4 oz/week    14 Glasses of wine per week     Comment: daily wine 2 glasses  . Drug Use: No  . Sexually Active: Not on file   Other Topics Concern  . Not on file   Social History Narrative  . No narrative on file      Physical Exam: BP 80/58  Pulse 68  Ht 5\' 7"  (1.702 m)  Wt 174 lb 6 oz (79.096 kg)  BMI 27.3 kg/m2 Constitutional: generally well-appearing Psychiatric: alert and oriented x3 Abdomen: soft, nontender, nondistended, no obvious ascites, no peritoneal signs, normal bowel sounds Rectal examination: No clear external anal hemorrhoids, no distal rectal abnormality, stool is brown and Hemoccult negative, I cannot palpate any clear internal hemorrhoids    Assessment and plan: 72 y.o. male with slowly resolving anal rectal issues  Past to 3 weeks he has clearly improved. Looking at the big picture here he is taking 2 Lomotil once a day now whereas many months ago he was on 2 or 3 times as many Lomotil as well as several other medicines for his anal rectal spasms, discomforts. I encouraged him to continue the exact regimen he is on without any changes.

## 2012-11-16 NOTE — Patient Instructions (Addendum)
No changes to your current meds. Stay on two lomotil every morning, consider decreasing to 1 lomotil every AM in 2 months. Take one gas-ex with every lunch meal.

## 2012-11-24 ENCOUNTER — Encounter (INDEPENDENT_AMBULATORY_CARE_PROVIDER_SITE_OTHER): Payer: Self-pay | Admitting: General Surgery

## 2012-11-24 ENCOUNTER — Ambulatory Visit (INDEPENDENT_AMBULATORY_CARE_PROVIDER_SITE_OTHER): Payer: Medicare Other | Admitting: General Surgery

## 2012-11-24 VITALS — BP 108/56 | HR 64 | Resp 16 | Ht 69.0 in | Wt 173.8 lb

## 2012-11-24 DIAGNOSIS — K6289 Other specified diseases of anus and rectum: Secondary | ICD-10-CM

## 2012-11-24 NOTE — Progress Notes (Signed)
William Austad. is a 72 y.o. male who is here for a follow up visit regarding anal pain.  He tried canasa suppositories for about a week but had to stop due to difficulty with insertion.  He then had several weeks of normal BM's.  Then he had an episode of bleeding.  He has had some irritation since then.  Objective: Filed Vitals:   11/24/12 1211  BP: 108/56  Pulse: 64  Resp: 16    General appearance: alert and cooperative anal: internal edema and inflammation   Assessment and Plan: I have asked him to try the canasa once more given his symptoms improved while he was using it and for 2 weeks afterwards.  We will see if this was due to the canasa.  I will see him back in 4 weeks.      Vanita Panda, MD [facility], [place] [phone]

## 2012-11-24 NOTE — Patient Instructions (Signed)
Try the canasa, if possible, again for several days in a row and see if you get similar results.

## 2012-12-24 ENCOUNTER — Encounter (INDEPENDENT_AMBULATORY_CARE_PROVIDER_SITE_OTHER): Payer: Self-pay | Admitting: General Surgery

## 2012-12-24 ENCOUNTER — Ambulatory Visit (INDEPENDENT_AMBULATORY_CARE_PROVIDER_SITE_OTHER): Payer: Medicare Other | Admitting: General Surgery

## 2012-12-24 VITALS — BP 110/68 | HR 56 | Resp 16 | Ht 68.0 in | Wt 173.2 lb

## 2012-12-24 DIAGNOSIS — K6289 Other specified diseases of anus and rectum: Secondary | ICD-10-CM

## 2012-12-24 NOTE — Patient Instructions (Signed)
Continue hemorrhoid cream as needed.  Make sure to relax completely with every bowel movement

## 2012-12-24 NOTE — Progress Notes (Signed)
William Swanson. is a 72 y.o. male who is here for a follow up visit regarding his anal pain.  He has tried another round of canasa and feels much better.  He is having 2 BM's a day.  He has occasional anal pain that is relieved with anusol and ibuprofen.    Objective: Filed Vitals:   12/24/12 1537  BP: 110/68  Pulse: 56  Resp: 16    General appearance: alert and cooperative Anal: sphincter hypertension, min internal and perianal inflammation   Assessment and Plan: RTO PRN.  Ok to use Anusol PRN    .Vanita Panda, MD [facility], [place] [phone]

## 2013-04-06 ENCOUNTER — Ambulatory Visit (INDEPENDENT_AMBULATORY_CARE_PROVIDER_SITE_OTHER): Payer: Medicare Other | Admitting: General Surgery

## 2013-04-06 ENCOUNTER — Encounter (INDEPENDENT_AMBULATORY_CARE_PROVIDER_SITE_OTHER): Payer: Self-pay | Admitting: General Surgery

## 2013-04-06 ENCOUNTER — Encounter (INDEPENDENT_AMBULATORY_CARE_PROVIDER_SITE_OTHER): Payer: Self-pay

## 2013-04-06 VITALS — BP 108/74 | HR 72 | Temp 97.8°F | Resp 16 | Ht 69.0 in | Wt 181.6 lb

## 2013-04-06 DIAGNOSIS — K4090 Unilateral inguinal hernia, without obstruction or gangrene, not specified as recurrent: Secondary | ICD-10-CM

## 2013-04-06 NOTE — Progress Notes (Signed)
Patient ID: William Sparacino., male   DOB: January 05, 1941, 72 y.o.   MRN: [id number]  Chief Complaint  Patient presents with  . Inguinal Hernia    new pt- eval [facility]    HPI William Lamere. is a 72 y.o. male.  William Swanson is referred back to me by Dr. Clelia Croft for evaluation of right groin pain, rule out hernia.  William Swanson underwent laparoscopic repair of left inguinal hernia with mesh by me on 12/24/2011. That healed well and William Swanson has had no recurrence. William Swanson had lots of problems with diarrhea and rectal problems postop. This was very frustrating. Dr. Maisie Fus and Dr. Christella Hartigan helped him a great deal than those symptoms have now resolved.  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 That went well. William Swanson states that William Swanson has recently, 6 weeks ago developed some right groin pain and also has some right gluteal pain. The right gluteal pain has resolved. William Swanson tried Advil and other NSAIDS and that did not help. William Swanson says the pain is intermittent, not that severe, aggravated by exercise, does not hurt at rest. William Swanson feels some tenderness in his right groin but doesn't feel a mass.The pain is asked is somewhat better now. William Swanson states that William Swanson saw Dr. Neale Burly and  then had a second evaluation by Dr. Clelia Croft and they could not confirm a hernia. William Swanson was referred for evaluation.  HPI  Past Medical History  Diagnosis Date  . BPH (benign prostatic hyperplasia)     mild  . Frequency of urination   . Hyperlipemia   . No pertinent past medical history   . Glaucoma     suspect  . Claustrophobia   . HSV-1 (herpes simplex virus 1) infection   . Osteopenia   . Weight loss   . OAB (overactive bladder)   . Spinal stenosis   . Bilateral carpal tunnel syndrome   . Humerus fracture   . Morton's neuroma   . Left groin pain     Past Surgical History  Procedure Laterality Date  . Colonoscopy    . Foot neuroma surgery  yrs ago    lt foot  . Lumbar epidural injection  yrs ago  . Carpal tunnel release  07/23/2011, left    Procedure:  CARPAL TUNNEL RELEASE;  Surgeon: Nicki Reaper, MD;  Location: [facility];  Service: Orthopedics;  Laterality: Left;  . Tonsillectomy  as child  . Inguinal hernia repair  12/24/2011    Procedure: LAPAROSCOPIC INGUINAL HERNIA;  Surgeon: Ernestene Mention, MD;  Location: [facility];  Service: General;  Laterality: Left;  . Colonoscopy  02/05/2012    Procedure: COLONOSCOPY;  Surgeon: Rachael Fee, MD;  Location: [facility];  Service: Endoscopy;  Laterality: N/A;    Family History  Problem Relation Age of Onset  . Heart disease Father   . Cancer Mother     ovarian  . Nephrolithiasis Brother     Social History History  Substance Use Topics  . Smoking status: Former Smoker -- 1.00 packs/day for 15 years    Types: Cigarettes    Quit date: 07/17/1975  . Smokeless tobacco: Never Used  . Alcohol Use: 8.4 oz/week    14 Glasses of wine per week     Comment: daily wine 2 glasses    Allergies  Allergen Reactions  . Bee Venom     Current Outpatient Prescriptions  Medication Sig Dispense Refill  . alfuzosin (UROXATRAL) 10 MG 24  hr tablet Take 10 mg by mouth every morning.       Marland Kitchen ALPRAZolam (XANAX) 0.5 MG tablet       . aspirin 81 MG tablet Take 81 mg by mouth daily.      Marland Kitchen atorvastatin (LIPITOR) 20 MG tablet Take 20 mg by mouth daily before breakfast.       . Calcium Carbonate-Vitamin D (CALTRATE 600+D PO) Take 1 tablet by mouth 2 (two) times daily.       . cholecalciferol (VITAMIN D) 1000 UNITS tablet Take 1,000 Units by mouth daily.      . diazepam (VALIUM) 5 MG tablet Take 5 mg by mouth every 8 (eight) hours as needed. anxiety      . EPINEPHrine (EPIPEN 2-PAK) 0.3 mg/0.3 mL DEVI Inject 0.3 mg into the muscle as needed.      Marland Kitchen escitalopram (LEXAPRO) 10 MG tablet 10 mg daily.       . finasteride (PROSCAR) 5 MG tablet Take 5 mg by mouth every morning.       . fish oil-omega-3 fatty acids 1000 MG capsule Take 1 g by mouth daily.       . INTERMEZZO 3.5 MG SUBL       .  Multiple Vitamin (MULTIVITAMIN) tablet Take 1 tablet by mouth daily.      . Timolol Maleate (ISTALOL) 0.5 % (DAILY) SOLN Place 1 drop into both eyes every morning.       . valACYclovir (VALTREX) 1000 MG tablet Take 1,000 mg by mouth as needed.      . vitamin C (ASCORBIC ACID) 500 MG tablet Take 500 mg by mouth daily.       No current facility-administered medications for this visit.    Review of Systems Review of Systems  Constitutional: Negative for fever, chills and unexpected weight change.  HENT: Negative for congestion, hearing loss, sore throat, trouble swallowing and voice change.   Eyes: Negative for visual disturbance.  Respiratory: Negative for cough and wheezing.   Cardiovascular: Negative for chest pain, palpitations and leg swelling.  Gastrointestinal: Negative for nausea, vomiting, abdominal pain, diarrhea, constipation, blood in stool, abdominal distention, anal bleeding and rectal pain.  Genitourinary: Negative for hematuria and difficulty urinating.  Musculoskeletal: Positive for back pain. Negative for arthralgias.  Skin: Negative for rash and wound.  Neurological: Negative for seizures, syncope, weakness and headaches.  Hematological: Negative for adenopathy. Does not bruise/bleed easily.  Psychiatric/Behavioral: Negative for confusion.    Blood pressure 108/74, pulse 72, temperature 97.8 F (36.6 C), temperature source Temporal, resp. rate 16, height 5\' 9"  (1.753 m), weight 181 lb 9.6 oz (82.373 kg).  Physical Exam Physical Exam  Constitutional: William Swanson is oriented to person, place, and time. William Swanson appears well-developed and well-nourished. No distress.  HENT:  Head: Normocephalic.  Nose: Nose normal.  Mouth/Throat: No oropharyngeal exudate.  Eyes: Conjunctivae and EOM are normal. Pupils are equal, round, and reactive to light. Right eye exhibits no discharge. Left eye exhibits no discharge. No scleral icterus.  Neck: Normal range of motion. Neck supple. No JVD present.  No tracheal deviation present. No thyromegaly present.  Cardiovascular: Normal rate, regular rhythm, normal heart sounds and intact distal pulses.   No murmur heard. Pulmonary/Chest: Effort normal and breath sounds normal. No stridor. No respiratory distress. William Swanson has no wheezes. William Swanson has no rales. William Swanson exhibits no tenderness.  Abdominal: Soft. Bowel sounds are normal. William Swanson exhibits no distension and no mass. There is no tenderness. There is no rebound and  no guarding.  Transverse incision below umbilicus well healed. No hernia.  Genitourinary:  William Swanson does have a small right inguinal hernia. This is obvious. It is reducible. It does not extend into the external ring. The left inguinal and femoral areas feel normal. The hernia repair is intact. Penis scrotum and testes are normal.  Musculoskeletal: Normal range of motion. William Swanson exhibits no edema and no tenderness.  Lymphadenopathy:    William Swanson has no cervical adenopathy.  Neurological: William Swanson is alert and oriented to person, place, and time. William Swanson has normal reflexes. Coordination normal.  Skin: Skin is warm and dry. No rash noted. William Swanson is not diaphoretic. No erythema. No pallor.  Psychiatric: William Swanson has a normal mood and affect. His behavior is normal. Judgment and thought content normal.    Data Reviewed I reviewed all of my old records.  Assessment    Symptomatic right inguinal hernia. Small. No history of incarceration. Eventually will need elective repair, but not urgently.   History laparoscopic repair of left inguinal hernia with mesh. No evidence of recurrence.  Hyperlipidemia  BPH on Proscar.    Plan    Arhaan would like to have his right inguinal hernia repaired at some point in time. William Swanson is fearful of general anesthesia, and William Swanson believes that it caused his diarrhea. I told him I could not confirm or refute that.   I told him that the best approach here would be an open repair of his right inguinal hernia with mesh under local anesthesia with monitored  sedation. This can be done as an outpatient. William Swanson likes this plan.  I discussed the indications, details, techniques, and numerous risk of the surgery with him. William Swanson is aware of the risk of bleeding, infection, recurrence, nerve damage with chronic numbness or pain, injury to the testicle or intestine with reconstructive surgery, and other unforeseen problems. William Swanson understands these issues well. At this time his questions are answered. William Swanson agrees with this plan. William Swanson will call me when William Swanson is ready to schedule the surgery.        Angelia Mould. Derrell Lolling, M.D., [facility] General and Minimally invasive Surgery Breast and Colorectal Surgery Office:   [phone] Pager:   [phone]  04/06/2013, 5:11 PM

## 2013-04-06 NOTE — Patient Instructions (Signed)
Physical exam today confirms that syou have a reducible right inguinal hernia. There is no immediate urgency to perform an elective repair of this. It will need to be repaired at some point in the future.  We can repair this as an open repair with mesh. We can do this under local anesthesia with intravenous sedation and avoid the general anesthesia that you had last time.  Give [place] a call when you think that you would like to have this surgery scheduled.     Inguinal Hernia, Adult Muscles help keep everything in the body in its proper place. But if a weak spot in the muscles develops, something can poke through. That is called a hernia. When this happens in the lower part of the belly (abdomen), it is called an inguinal hernia. (It takes its name from a part of the body in this region called the inguinal canal.) A weak spot in the wall of muscles lets some fat or part of the small intestine bulge through. An inguinal hernia can develop at any age. Men get them more often than women. CAUSES  In adults, an inguinal hernia develops over time.  It can be triggered by:  Suddenly straining the muscles of the lower abdomen.  Lifting heavy objects.  Straining to have a bowel movement. Difficult bowel movements (constipation) can lead to this.  Constant coughing. This may be caused by smoking or lung disease.  Being overweight.  Being pregnant.  Working at a job that requires long periods of standing or heavy lifting.  Having had an inguinal hernia before. One type can be an emergency situation. It is called a strangulated inguinal hernia. It develops if part of the small intestine slips through the weak spot and cannot get back into the abdomen. The blood supply can be cut off. If that happens, part of the intestine may die. This situation requires emergency surgery. SYMPTOMS  Often, a small inguinal hernia has no symptoms. It is found when a healthcare provider does a physical exam. Larger  hernias usually have symptoms.   In adults, symptoms may include:  A lump in the groin. This is easier to see when the person is standing. It might disappear when lying down.  In men, a lump in the scrotum.  Pain or burning in the groin. This occurs especially when lifting, straining or coughing.  A dull ache or feeling of pressure in the groin.  Signs of a strangulated hernia can include:  A bulge in the groin that becomes very painful and tender to the touch.  A bulge that turns red or purple.  Fever, nausea and vomiting.  Inability to have a bowel movement or to pass gas. DIAGNOSIS  To decide if you have an inguinal hernia, a healthcare provider will probably do a physical examination.  This will include asking questions about any symptoms you have noticed.  The healthcare provider might feel the groin area and ask you to cough. If an inguinal hernia is felt, the healthcare provider may try to slide it back into the abdomen.  Usually no other tests are needed. TREATMENT  Treatments can vary. The size of the hernia makes a difference. Options include:  Watchful waiting. This is often suggested if the hernia is small and you have had no symptoms.  No medical procedure will be done unless symptoms develop.  You will need to watch closely for symptoms. If any occur, contact your healthcare provider right away.  Surgery. This is used if  the hernia is larger or you have symptoms.  Open surgery. This is usually an outpatient procedure (you will not stay overnight in a hospital). An cut (incision) is made through the skin in the groin. The hernia is put back inside the abdomen. The weak area in the muscles is then repaired by herniorrhaphy or hernioplasty. Herniorrhaphy: in this type of surgery, the weak muscles are sewn back together. Hernioplasty: a patch or mesh is used to close the weak area in the abdominal wall.  Laparoscopy. In this procedure, a surgeon makes small  incisions. A thin tube with a tiny video camera (called a laparoscope) is put into the abdomen. The surgeon repairs the hernia with mesh by looking with the video camera and using two long instruments. HOME CARE INSTRUCTIONS   After surgery to repair an inguinal hernia:  You will need to take pain medicine prescribed by your healthcare provider. Follow all directions carefully.  You will need to take care of the wound from the incision.  Your activity will be restricted for awhile. This will probably include no heavy lifting for several weeks. You also should not do anything too active for a few weeks. When you can return to work will depend on the type of job that you have.  During "watchful waiting" periods, you should:  Maintain a healthy weight.  Eat a diet high in fiber (fruits, vegetables and whole grains).  Drink plenty of fluids to avoid constipation. This means drinking enough water and other liquids to keep your urine clear or pale yellow.  Do not lift heavy objects.  Do not stand for long periods of time.  Quit smoking. This should keep you from developing a frequent cough. SEEK MEDICAL CARE IF:   A bulge develops in your groin area.  You feel pain, a burning sensation or pressure in the groin. This might be worse if you are lifting or straining.  You develop a fever of more than 100.5 F (38.1 C). SEEK IMMEDIATE MEDICAL CARE IF:   Pain in the groin increases suddenly.  A bulge in the groin gets bigger suddenly and does not go down.  For men, there is sudden pain in the scrotum. Or, the size of the scrotum increases.  A bulge in the groin area becomes red or purple and is painful to touch.  You have nausea or vomiting that does not go away.  You feel your heart beating much faster than normal.  You cannot have a bowel movement or pass gas.  You develop a fever of more than 102.0 F (38.9 C). Document Released: 08/25/2008 Document Revised: 07/01/2011  Document Reviewed: 08/25/2008 [facility] Patient Information 2014 [place], [place].

## 2013-04-13 ENCOUNTER — Encounter (INDEPENDENT_AMBULATORY_CARE_PROVIDER_SITE_OTHER): Payer: Self-pay

## 2013-04-27 ENCOUNTER — Encounter (HOSPITAL_BASED_OUTPATIENT_CLINIC_OR_DEPARTMENT_OTHER): Payer: Self-pay | Admitting: *Deleted

## 2013-04-27 NOTE — Progress Notes (Signed)
Pt has been here in past-no cardiac or resp problems Has had constipation post op-to get some stool softners

## 2013-04-28 NOTE — H&P (Signed)
William Swanson.    MRN:  [id number]   Description: 73 year old male  Provider: [name], MD  Department: [facility]          Diagnoses      Right inguinal hernia    -  Primary      550.90               Current Vitals - Last Recorded      BP Pulse Temp(Src) Resp Ht Wt      108/74 72 97.8 F (36.6 C) (Temporal) 16 5\' 9"  (1.753 m) 181 lb 9.6 oz (82.373 kg)    BMI 26.81 kg/m2                     History and Physical   [name], MD     Status: Signed            Patient ID: William Swanson., male   DOB: 1940/08/25, 73 y.o.   MRN: [id number]            HPI [name]. is a 73 y.o. male.  He is referred back to me by Dr. Brigitte Pulse for evaluation of right groin pain, rule out hernia.   Pilar Plate underwent laparoscopic repair of left inguinal hernia with mesh by me on 12/24/2011. That healed well and he has had no recurrence. He had lots of problems with diarrhea and rectal problems postop. This was very frustrating. Dr. Marcello Moores and Dr. Ardis Hughs helped him a great deal than those symptoms have now resolved.   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 He states that he has recently, 6 weeks ago developed some right groin pain and also has some right gluteal pain. The right gluteal pain has resolved. He tried Advil and other NSAIDS and that did not help. He says the pain is intermittent, not that severe, aggravated by exercise, does not hurt at rest. He feels some tenderness in his right groin but doesn't feel a mass.The pain is asked is somewhat better now. He states that he saw Dr. Rosana Hoes and  then had a second evaluation by Dr. Brigitte Pulse and they could not confirm a hernia. He was referred for evaluation.        Past Medical History   Diagnosis  Date   .  BPH (benign prostatic hyperplasia)         mild   .  Frequency of urination     .  Hyperlipemia     .  No pertinent past medical history     .  Glaucoma          suspect   .  Claustrophobia     .  HSV-1 (herpes simplex virus 1) infection     .  Osteopenia     .  Weight loss     .  OAB (overactive bladder)     .  Spinal stenosis     .  Bilateral carpal tunnel syndrome     .  Humerus fracture     .  Morton's neuroma     .  Left groin pain           Past Surgical History   Procedure  Laterality  Date   .  Colonoscopy       .  Foot neuroma surgery    yrs ago  lt foot   .  Lumbar epidural injection    yrs ago   .  Carpal tunnel release    07/23/2011, left       Procedure: CARPAL TUNNEL RELEASE;  Surgeon: Wynonia Sours, MD;  Location: [facility];  Service: Orthopedics;  Laterality: Left;   .  Tonsillectomy    as child   .  Inguinal hernia repair    12/24/2011       Procedure: LAPAROSCOPIC INGUINAL HERNIA;  Surgeon: [name], MD;  Location: [facility];  Service: General;  Laterality: Left;   .  Colonoscopy    02/05/2012       Procedure: COLONOSCOPY;  Surgeon: Milus Banister, MD;  Location: [facility];  Service: Endoscopy;  Laterality: N/A;         Family History   Problem  Relation  Age of Onset   .  Heart disease  Father     .  Cancer  Mother         ovarian   .  Nephrolithiasis  Brother          Social History History   Substance Use Topics   .  Smoking status:  Former Smoker -- 1.00 packs/day for 15 years       Types:  Cigarettes       Quit date:  07/17/1975   .  Smokeless tobacco:  Never Used   .  Alcohol Use:  8.4 oz/week       14 Glasses of wine per week         Comment: daily wine 2 glasses         Allergies   Allergen  Reactions   .  Bee Venom           Current Outpatient Prescriptions   Medication  Sig  Dispense  Refill   .  alfuzosin (UROXATRAL) 10 MG 24 hr tablet  Take 10 mg by mouth every morning.          Marland Kitchen  ALPRAZolam (XANAX) 0.5 MG tablet           .  aspirin 81 MG tablet  Take 81 mg by mouth daily.         Marland Kitchen  atorvastatin (LIPITOR) 20 MG tablet  Take 20 mg by mouth daily  before breakfast.          .  Calcium Carbonate-Vitamin D (CALTRATE 600+D PO)  Take 1 tablet by mouth 2 (two) times daily.          .  cholecalciferol (VITAMIN D) 1000 UNITS tablet  Take 1,000 Units by mouth daily.         .  diazepam (VALIUM) 5 MG tablet  Take 5 mg by mouth every 8 (eight) hours as needed. anxiety         .  EPINEPHrine (EPIPEN 2-PAK) 0.3 mg/0.3 mL DEVI  Inject 0.3 mg into the muscle as needed.         Marland Kitchen  escitalopram (LEXAPRO) 10 MG tablet  10 mg daily.          .  finasteride (PROSCAR) 5 MG tablet  Take 5 mg by mouth every morning.          .  fish oil-omega-3 fatty acids 1000 MG capsule  Take 1 g by mouth daily.          .  INTERMEZZO 3.5 MG SUBL           .  Multiple Vitamin (MULTIVITAMIN) tablet  Take 1 tablet by mouth daily.         .  Timolol Maleate (ISTALOL) 0.5 % (DAILY) SOLN  Place 1 drop into both eyes every morning.          .  valACYclovir (VALTREX) 1000 MG tablet  Take 1,000 mg by mouth as needed.         .  vitamin C (ASCORBIC ACID) 500 MG tablet  Take 500 mg by mouth daily.             No current facility-administered medications for this visit.        Review of Systems   Constitutional: Negative for fever, chills and unexpected weight change.  HENT: Negative for congestion, hearing loss, sore throat, trouble swallowing and voice change.   Eyes: Negative for visual disturbance.  Respiratory: Negative for cough and wheezing.   Cardiovascular: Negative for chest pain, palpitations and leg swelling.  Gastrointestinal: Negative for nausea, vomiting, abdominal pain, diarrhea, constipation, blood in stool, abdominal distention, anal bleeding and rectal pain.  Genitourinary: Negative for hematuria and difficulty urinating.  Musculoskeletal: Positive for back pain. Negative for arthralgias.  Skin: Negative for rash and wound.  Neurological: Negative for seizures, syncope, weakness and headaches.  Hematological: Negative for adenopathy. Does not bruise/bleed  easily.  Psychiatric/Behavioral: Negative for confusion.      Blood pressure 108/74, pulse 72, temperature 97.8 F (36.6 C), temperature source Temporal, resp. rate 16, height 5\' 9"  (1.753 m), weight 181 lb 9.6 oz (82.373 kg).   Physical Exam  Constitutional: He is oriented to person, place, and time. He appears well-developed and well-nourished. No distress.  HENT:   Head: Normocephalic.   Nose: Nose normal.   Mouth/Throat: No oropharyngeal exudate.  Eyes: Conjunctivae and EOM are normal. Pupils are equal, round, and reactive to light. Right eye exhibits no discharge. Left eye exhibits no discharge. No scleral icterus.  Neck: Normal range of motion. Neck supple. No JVD present. No tracheal deviation present. No thyromegaly present.  Cardiovascular: Normal rate, regular rhythm, normal heart sounds and intact distal pulses.    No murmur heard. Pulmonary/Chest: Effort normal and breath sounds normal. No stridor. No respiratory distress. He has no wheezes. He has no rales. He exhibits no tenderness.  Abdominal: Soft. Bowel sounds are normal. He exhibits no distension and no mass. There is no tenderness. There is no rebound and no guarding.  Transverse incision below umbilicus well healed. No hernia.  Genitourinary:  He does have a small right inguinal hernia. This is obvious. It is reducible. It does not extend into the external ring. The left inguinal and femoral areas feel normal. The hernia repair is intact. Penis scrotum and testes are normal.  Musculoskeletal: Normal range of motion. He exhibits no edema and no tenderness.  Lymphadenopathy:    He has no cervical adenopathy.  Neurological: He is alert and oriented to person, place, and time. He has normal reflexes. Coordination normal.  Skin: Skin is warm and dry. No rash noted. He is not diaphoretic. No erythema. No pallor.  Psychiatric: He has a normal mood and affect. His behavior is normal. Judgment and thought content normal.       Data Reviewed I reviewed all of my old records.   Assessment    Symptomatic right inguinal hernia. Small. No history of incarceration. Eventually will need elective repair, but not urgently.    History laparoscopic repair of left inguinal hernia with mesh. No evidence of  recurrence.   Hyperlipidemia   BPH on Proscar.     Plan    Finch would like to have his right inguinal hernia repaired at some point in time. He is fearful of general anesthesia, and he believes that it caused his diarrhea. I told him I could not confirm or refute that.   I told him that the best approach here would be an open repair of his right inguinal hernia with mesh under local anesthesia with monitored sedation. This can be done as an outpatient. He likes this plan.   I discussed the indications, details, techniques, and numerous risk of the surgery with him. He is aware of the risk of bleeding, infection, recurrence, nerve damage with chronic numbness or pain, injury to the testicle or intestine with reconstructive surgery, and other unforeseen problems. He understands these issues well. At this time his questions are answered. He agrees with this plan. He will call me when he is ready to schedule the surgery.         Edsel Petrin. Dalbert Batman, M.D., [facility] General and Minimally invasive Surgery Breast and Colorectal Surgery Office:   [phone] Pager:   [phone]

## 2013-04-30 ENCOUNTER — Ambulatory Visit (HOSPITAL_BASED_OUTPATIENT_CLINIC_OR_DEPARTMENT_OTHER): Payer: Medicare Other | Admitting: Certified Registered"

## 2013-04-30 ENCOUNTER — Encounter (HOSPITAL_BASED_OUTPATIENT_CLINIC_OR_DEPARTMENT_OTHER)
Admission: RE | Disposition: A | Discharge status: Home / Self Care | Payer: Self-pay | Source: Ambulatory Visit | Attending: General Surgery

## 2013-04-30 ENCOUNTER — Encounter (HOSPITAL_BASED_OUTPATIENT_CLINIC_OR_DEPARTMENT_OTHER): Payer: Self-pay | Admitting: Certified Registered"

## 2013-04-30 ENCOUNTER — Encounter (HOSPITAL_BASED_OUTPATIENT_CLINIC_OR_DEPARTMENT_OTHER): Payer: Medicare Other | Admitting: Certified Registered"

## 2013-04-30 ENCOUNTER — Ambulatory Visit (HOSPITAL_BASED_OUTPATIENT_CLINIC_OR_DEPARTMENT_OTHER)
Admission: RE | Admit: 2013-04-30 | Discharge: 2013-04-30 | Disposition: A | Discharge status: Home / Self Care | Payer: Medicare Other | Source: Ambulatory Visit | Attending: General Surgery | Admitting: General Surgery

## 2013-04-30 DIAGNOSIS — K4090 Unilateral inguinal hernia, without obstruction or gangrene, not specified as recurrent: Secondary | ICD-10-CM

## 2013-04-30 DIAGNOSIS — Z87891 Personal history of nicotine dependence: Secondary | ICD-10-CM | POA: Insufficient documentation

## 2013-04-30 HISTORY — PX: INSERTION OF MESH: SHX5868

## 2013-04-30 HISTORY — PX: INGUINAL HERNIA REPAIR: SHX194

## 2013-04-30 HISTORY — DX: Presence of spectacles and contact lenses: Z97.3

## 2013-04-30 LAB — POCT HEMOGLOBIN-HEMACUE: Hemoglobin: 15.5 g/dL (ref 13.0–17.0)

## 2013-04-30 SURGERY — REPAIR, HERNIA, INGUINAL, ADULT
Anesthesia: Monitor Anesthesia Care | Site: Groin | Laterality: Right

## 2013-04-30 MED ORDER — SODIUM BICARBONATE 4 % IV SOLN
INTRAVENOUS | Status: DC | PRN
  Administered 2013-04-30: 10:00:00

## 2013-04-30 MED ORDER — CEFAZOLIN SODIUM-DEXTROSE 2-3 GM-% IV SOLR
INTRAVENOUS | Status: AC
  Filled 2013-04-30: qty 50

## 2013-04-30 MED ORDER — PROPOFOL INFUSION 10 MG/ML OPTIME
INTRAVENOUS | Status: DC | PRN
  Administered 2013-04-30: 75 ug/kg/min via INTRAVENOUS

## 2013-04-30 MED ORDER — LACTATED RINGERS IV SOLN
INTRAVENOUS | Status: DC
  Administered 2013-04-30: 08:00:00 via INTRAVENOUS
  Administered 2013-04-30: 20 mL/h via INTRAVENOUS

## 2013-04-30 MED ORDER — SODIUM CHLORIDE 0.9 % IJ SOLN
INTRAMUSCULAR | Status: AC
  Filled 2013-04-30: qty 20

## 2013-04-30 MED ORDER — OXYCODONE-ACETAMINOPHEN 7.5-325 MG PO TABS: 1.0000 | ORAL_TABLET | ORAL | Status: DC | PRN

## 2013-04-30 MED ORDER — CHLORHEXIDINE GLUCONATE 4 % EX LIQD: 1.0000 "application " | Freq: Once | CUTANEOUS | Status: DC

## 2013-04-30 MED ORDER — ONDANSETRON HCL 4 MG/2ML IJ SOLN: 4.0000 mg | Freq: Once | INTRAMUSCULAR | Status: DC | PRN

## 2013-04-30 MED ORDER — LIDOCAINE HCL (CARDIAC) 20 MG/ML IV SOLN
INTRAVENOUS | Status: DC | PRN
Start: 2013-04-30 — End: 2013-04-30
  Administered 2013-04-30: 60 mg via INTRAVENOUS

## 2013-04-30 MED ORDER — MEPERIDINE HCL 25 MG/ML IJ SOLN: 6.2500 mg | INTRAMUSCULAR | Status: DC | PRN

## 2013-04-30 MED ORDER — FENTANYL CITRATE 0.05 MG/ML IJ SOLN
INTRAMUSCULAR | Status: AC
  Filled 2013-04-30: qty 6

## 2013-04-30 MED ORDER — SODIUM BICARBONATE 4 % IV SOLN
INTRAVENOUS | Status: AC
  Filled 2013-04-30: qty 5

## 2013-04-30 MED ORDER — OXYCODONE HCL 5 MG/5ML PO SOLN: 5.0000 mg | Freq: Once | ORAL | Status: DC | PRN

## 2013-04-30 MED ORDER — FENTANYL CITRATE 0.05 MG/ML IJ SOLN: 50.0000 ug | INTRAMUSCULAR | Status: DC | PRN

## 2013-04-30 MED ORDER — BUPIVACAINE-EPINEPHRINE PF 0.5-1:200000 % IJ SOLN
INTRAMUSCULAR | Status: AC
  Filled 2013-04-30: qty 30

## 2013-04-30 MED ORDER — FENTANYL CITRATE 0.05 MG/ML IJ SOLN
INTRAMUSCULAR | Status: DC | PRN
  Administered 2013-04-30: 50 ug via INTRAVENOUS

## 2013-04-30 MED ORDER — MIDAZOLAM HCL 2 MG/2ML IJ SOLN: 1.0000 mg | INTRAMUSCULAR | Status: DC | PRN

## 2013-04-30 MED ORDER — HYDROMORPHONE HCL PF 1 MG/ML IJ SOLN: 0.2500 mg | INTRAMUSCULAR | Status: DC | PRN

## 2013-04-30 MED ORDER — ONDANSETRON HCL 4 MG/2ML IJ SOLN
INTRAMUSCULAR | Status: DC | PRN
  Administered 2013-04-30: 4 mg via INTRAVENOUS

## 2013-04-30 MED ORDER — OXYCODONE HCL 5 MG PO TABS: 5.0000 mg | ORAL_TABLET | Freq: Once | ORAL | Status: DC | PRN

## 2013-04-30 MED ORDER — CEFAZOLIN SODIUM-DEXTROSE 2-3 GM-% IV SOLR
2.0000 g | INTRAVENOUS | Status: AC
  Administered 2013-04-30: 2 g via INTRAVENOUS

## 2013-04-30 SURGICAL SUPPLY — 66 items
ADH SKN CLS APL DERMABOND .7 (GAUZE/BANDAGES/DRESSINGS) ×1
APL SKNCLS STERI-STRIP NONHPOA (GAUZE/BANDAGES/DRESSINGS)
BENZOIN TINCTURE PRP APPL 2/3 (GAUZE/BANDAGES/DRESSINGS) IMPLANT
BLADE HEX COATED 2.75 (ELECTRODE) ×3 IMPLANT
BLADE SURG 10 STRL SS (BLADE) ×3 IMPLANT
BLADE SURG ROTATE 9660 (MISCELLANEOUS) ×2 IMPLANT
CANISTER SUCT 1200ML W/VALVE (MISCELLANEOUS) ×3 IMPLANT
CHLORAPREP W/TINT 26ML (MISCELLANEOUS) ×2 IMPLANT
CLOSURE WOUND 1/2 X4 (GAUZE/BANDAGES/DRESSINGS)
COVER MAYO STAND STRL (DRAPES) ×3 IMPLANT
COVER TABLE BACK 60X90 (DRAPES) ×3 IMPLANT
DECANTER SPIKE VIAL GLASS SM (MISCELLANEOUS) IMPLANT
DERMABOND ADVANCED (GAUZE/BANDAGES/DRESSINGS) ×2
DERMABOND ADVANCED .7 DNX12 (GAUZE/BANDAGES/DRESSINGS) IMPLANT
DRAIN PENROSE 1/2X12 LTX STRL (WOUND CARE) ×3 IMPLANT
DRAPE LAPAROTOMY TRNSV 102X78 (DRAPE) IMPLANT
DRAPE PED LAPAROTOMY (DRAPES) ×3 IMPLANT
DRAPE UTILITY XL STRL (DRAPES) ×2 IMPLANT
ELECT REM PT RETURN 9FT ADLT (ELECTROSURGICAL) ×3
ELECTRODE REM PT RTRN 9FT ADLT (ELECTROSURGICAL) ×1 IMPLANT
GLOVE BIO SURGEON STRL SZ7 (GLOVE) ×2 IMPLANT
GLOVE BIOGEL PI IND STRL 7.5 (GLOVE) IMPLANT
GLOVE BIOGEL PI INDICATOR 7.5 (GLOVE) ×2
GLOVE EPREMIER NITRL EXT CFF L (GLOVE) IMPLANT
GLOVE EUDERMIC 7 POWDERFREE (GLOVE) ×3 IMPLANT
GLOVE EXAM NITRILE EXT CFF LRG (GLOVE) ×3 IMPLANT
GOWN STRL REUS W/ TWL LRG LVL3 (GOWN DISPOSABLE) ×1 IMPLANT
GOWN STRL REUS W/ TWL XL LVL3 (GOWN DISPOSABLE) ×1 IMPLANT
GOWN STRL REUS W/TWL LRG LVL3 (GOWN DISPOSABLE) ×3
GOWN STRL REUS W/TWL XL LVL3 (GOWN DISPOSABLE) ×3
MESH ULTRAPRO 3X6 7.6X15CM (Mesh General) ×2 IMPLANT
NDL HYPO 25X1 1.5 SAFETY (NEEDLE) ×1 IMPLANT
NEEDLE HYPO 22GX1.5 SAFETY (NEEDLE) ×3 IMPLANT
NEEDLE HYPO 25X1 1.5 SAFETY (NEEDLE) ×3 IMPLANT
NS IRRIG 1000ML POUR BTL (IV SOLUTION) ×3 IMPLANT
PACK BASIN DAY SURGERY FS (CUSTOM PROCEDURE TRAY) ×3 IMPLANT
PENCIL BUTTON HOLSTER BLD 10FT (ELECTRODE) ×3 IMPLANT
SLEEVE SCD COMPRESS KNEE MED (MISCELLANEOUS) ×2 IMPLANT
SPONGE GAUZE 4X4 12PLY STER LF (GAUZE/BANDAGES/DRESSINGS) IMPLANT
SPONGE LAP 4X18 X RAY DECT (DISPOSABLE) IMPLANT
STAPLER VISISTAT 35W (STAPLE) IMPLANT
STRIP CLOSURE SKIN 1/2X4 (GAUZE/BANDAGES/DRESSINGS) IMPLANT
SUT MNCRL AB 4-0 PS2 18 (SUTURE) ×3 IMPLANT
SUT PROLENE 1 CT (SUTURE) IMPLANT
SUT PROLENE 2 0 CT2 30 (SUTURE) ×8 IMPLANT
SUT SILK 2 0 SH (SUTURE) ×2 IMPLANT
SUT SILK 2 0 TIES 17X18 (SUTURE) ×3
SUT SILK 2-0 18XBRD TIE BLK (SUTURE) ×1 IMPLANT
SUT SILK 3 0 SH 30 (SUTURE) IMPLANT
SUT VIC AB 2-0 CT1 27 (SUTURE)
SUT VIC AB 2-0 CT1 TAPERPNT 27 (SUTURE) IMPLANT
SUT VIC AB 2-0 SH 27 (SUTURE) ×3
SUT VIC AB 2-0 SH 27XBRD (SUTURE) ×1 IMPLANT
SUT VIC AB 3-0 54X BRD REEL (SUTURE) IMPLANT
SUT VIC AB 3-0 BRD 54 (SUTURE)
SUT VIC AB 3-0 FS2 27 (SUTURE) IMPLANT
SUT VIC AB 3-0 SH 27 (SUTURE) ×3
SUT VIC AB 3-0 SH 27X BRD (SUTURE) ×1 IMPLANT
SUT VICRYL 3-0 CR8 SH (SUTURE) IMPLANT
SYR 20CC LL (SYRINGE) ×3 IMPLANT
SYR CONTROL 10ML LL (SYRINGE) ×3 IMPLANT
TOWEL OR NON WOVEN STRL DISP B (DISPOSABLE) ×3 IMPLANT
TRAY DSU PREP LF (CUSTOM PROCEDURE TRAY) ×3 IMPLANT
TUBE CONNECTING 20'X1/4 (TUBING) ×1
TUBE CONNECTING 20X1/4 (TUBING) ×2 IMPLANT
YANKAUER SUCT BULB TIP NO VENT (SUCTIONS) ×3 IMPLANT

## 2013-04-30 NOTE — Anesthesia Postprocedure Evaluation (Signed)
Anesthesia Post Note  Patient: William Swanson.  Procedure(s) Performed: Procedure(s) (LRB): HERNIA REPAIR INGUINAL ADULT/MESH (Right) INSERTION OF MESH (Right)  Anesthesia type: general  Patient location: PACU  Post pain: Pain level controlled  Post assessment: Patient's Cardiovascular Status Stable  Last Vitals:  Filed Vitals:   04/30/13 1100  BP: 125/75  Pulse: 46  Temp: 36.4 C  Resp: 18    Post vital signs: Reviewed and stable  Level of consciousness: sedated  Complications: No apparent anesthesia complications

## 2013-04-30 NOTE — Interval H&P Note (Signed)
History and Physical Interval Note:  04/30/2013 8:00 AM  William Swanson.  has presented today for surgery, with the diagnosis of right inguinal hernia  The goals and the various methods of treatment have been discussed with the patient and family. After consideration of risks, benefits and other options for treatment, the patient has consented to  Procedure(s): HERNIA REPAIR INGUINAL ADULT/MESH (Right) INSERTION OF MESH (Right) as a surgical intervention .  The patient's history has been reviewed, patient examined, no change in status, stable for surgery.  I have reviewed the patient's chart and labs.  Questions were answered to the patient's satisfaction.     [name]

## 2013-04-30 NOTE — Discharge Instructions (Signed)
[facility] _______[facility] ° °UMBILICAL OR INGUINAL HERNIA REPAIR: POST OP INSTRUCTIONS ° °Always review your discharge instruction sheet given to you by the facility where your surgery was performed. °IF YOU HAVE DISABILITY OR FAMILY LEAVE FORMS, YOU MUST BRING THEM TO THE OFFICE FOR PROCESSING.   °DO NOT GIVE THEM TO YOUR DOCTOR. ° °1. A  prescription for pain medication may be given to you upon discharge.  Take your pain medication as prescribed, if needed.  If narcotic pain medicine is not needed, then you may take acetaminophen (Tylenol) or ibuprofen (Advil) as needed. °2. Take your usually prescribed medications unless otherwise directed. °3. If you need a refill on your pain medication, please contact your pharmacy.  They will contact our office to request authorization. Prescriptions will not be filled after 5 pm or on week-ends. °4. You should follow a light diet the first 24 hours after arrival home, such as soup and crackers, etc.  Be sure to include lots of fluids daily.  Resume your normal diet the day after surgery. °5. Most patients will experience some swelling and bruising around the umbilicus or in the groin and scrotum.  Ice packs and reclining will help.  Swelling and bruising can take several days to resolve.  °6. It is common to experience some constipation if taking pain medication after surgery.  Increasing fluid intake and taking a stool softener (such as Colace) will usually help or prevent this problem from occurring.  A mild laxative (Milk of Magnesia or Miralax) should be taken according to package directions if there are no bowel movements after 48 hours. °7. Unless discharge instructions indicate otherwise, you may remove your bandages 24-48 hours after surgery, and you may shower at that time.  You may have steri-strips (small skin tapes) in place directly over the incision.  These strips should be left on the skin for 7-10 days.  If your surgeon used skin glue on the  incision, you may shower in 24 hours.  The glue will flake off over the next 2-3 weeks.  Any sutures or staples will be removed at the office during your follow-up visit. °8. ACTIVITIES:  You may resume regular (light) daily activities beginning the next day--such as daily self-care, walking, climbing stairs--gradually increasing activities as tolerated.  You may have sexual intercourse when it is comfortable.  Refrain from any heavy lifting or straining until approved by your doctor. °a. You may drive when you are no longer taking prescription pain medication, you can comfortably wear a seatbelt, and you can safely maneuver your car and apply brakes. °b. RETURN TO WORK:  __________________________________________________________ °9. You should see your doctor in the office for a follow-up appointment approximately 2-3 weeks after your surgery.  Make sure that you call for this appointment within a day or two after you arrive home to insure a convenient appointment time. °10. OTHER INSTRUCTIONS:  __________________________________________________________________________________________________________________________________________________________________________________________  °WHEN TO CALL YOUR DOCTOR: °1. Fever over 101.0 °2. Inability to urinate °3. Nausea and/or vomiting °4. Extreme swelling or bruising °5. Continued bleeding from incision. °6. Increased pain, redness, or drainage from the incision ° °The clinic staff is available to answer your questions during regular business hours.  Please don’t hesitate to call and ask to speak to one of the nurses for clinical concerns.  If you have a medical emergency, go to the nearest emergency room or call 911.  A surgeon from [facility] is always on call at the hospital ° ° °  [address], [address], [place], [place]  [address] ? ° [address] [address], [place], [place]   [address] °[phone] ? [phone] ? FAX [phone] °Web site:  [email or web address] ° ° ° °Post Anesthesia Home Care Instructions ° °Activity: °Get plenty of rest for the remainder of the day. A responsible adult should stay with you for 24 hours following the procedure.  °For the next 24 hours, DO NOT: °-Drive a car °-Operate machinery °-Drink alcoholic beverages °-Take any medication unless instructed by your physician °-Make any legal decisions or sign important papers. ° °Meals: °Start with liquid foods such as gelatin or soup. Progress to regular foods as tolerated. Avoid greasy, spicy, heavy foods. If nausea and/or vomiting occur, drink only clear liquids until the nausea and/or vomiting subsides. Call your physician if vomiting continues. ° °Special Instructions/Symptoms: °Your throat may feel dry or sore from the anesthesia or the breathing tube placed in your throat during surgery. If this causes discomfort, gargle with warm salt water. The discomfort should disappear within 24 hours. ° °

## 2013-04-30 NOTE — Op Note (Signed)
Patient Name:           William Swanson.   Date of Surgery:        04/30/2013  Pre op Diagnosis:      Right inguinal hernia  Post op Diagnosis:    Same  Procedure:                 Open repair right inguinal hernia  Surgeon:                     Edsel Petrin. Dalbert Batman, M.D., FACS  Assistant:                      None  Operative Indications:   William Swanson. is a 73 y.o. male. He is referred back to me by Dr. Brigitte Pulse for evaluation of right groin pain, rule out hernia.  Pilar Plate underwent laparoscopic repair of left inguinal hernia with mesh by me on 12/24/2011. That healed well and he has had no recurrence.  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 He states that he has recently, 6 weeks ago developed some right groin pain and also has some right gluteal pain. The right gluteal pain has resolved. He tried Advil and other NSAIDS and that did not help. He says the pain is intermittent, not that severe, aggravated by exercise, does not hurt at rest. He feels some tenderness in his right groin but doesn't feel a mass.  My exam confirms a small reducible right inguinal hernia.   Operative Findings:       There was a small to medium-sized indirect right inguinal hernia. The hernia sac was empty. There was no evidence of direct hernia. There was no evidence of femoral hernia.  Procedure in Detail:          The patient was brought to the operating room. He was monitored and sedated by the anesthesia Department. The lower abdomen and genitalia were prepped and draped in a sterile fashion. Surgical time out was performed. Intravenous antibiotics were given. 0.5% Marcaine with epinephrine was used as local infiltration anesthetic in the skin subcutaneous tissue and deep muscle layers. A transverse incision was made in the right groin overlying the right inguinal canal. Dissection was carried down through subcutaneous tissue, exposing the aponeurosis of the external oblique. The  external oblique was incised in the direction of its fibers, opening up the external inguinal ring. retractors were placed. The ilioinguinal nerve was isolated, traced back to its emergence from the muscle laterally, clamped, divided and ligated with 2-0 silk tie. The redundant nerve medially was resected. The cord structures were mobilized and encircled with a Penrose drain. Cremasteric muscle fibers were skeletonized. Cord structures were identified and an indirect hernia sac was dissected away from the cord structures back to the   internal ring. I opened the indirect sac and could palpate within the peritoneal space. There was no femoral hernia. The indirect sac was twisted and suture ligated at the level of the internal ring with suture ligature of 2-0 silk. The redundant sac was excised and discarded. The floor of the inguinal canal was repaired and reinforced with a 3" x 6" piece of ultra Pro mesh. The mesh was trimmed at the corners to accommodate the anatomy of the wound and it was sutured in place with running sutures of 2-0 Prolene and interrupted mattress sutures of 2-0 Prolene. The mesh was  sutured so as to generously overlap the fascia at the pubic tubercle, and along the inguinal ligament inferiorly. Several mattress sutures were placed medially, superiorly, and superolaterally to position and fix the mesh. The mesh was incised laterally so as to wrap around the cord structures at the internal ring. The tails of the mesh were overlapped laterally and further sutures were placed laterally. This provided very secure repair both medial and lateral to the internal ring allowing adequate fingertip opening for the cord structures. The wound was irrigated with saline. Hemostasis was excellent. The external oblique was closed with a running suture of 2-0 Vicryl, Scarpa's fascia was closed with 3-0 Vicryl sutures and the skin closed with running subcuticular suture of 4-0 Monocryl and Dermabond. The patient  tolerated the procedure well. He was taken to PACU in good condition. There were no complications. EBL 10 cc. Counts correct.     Edsel Petrin. Dalbert Batman, M.D., FACS General and Minimally Invasive Surgery Breast and Colorectal Surgery  04/30/2013 9:58 AM

## 2013-04-30 NOTE — Anesthesia Preprocedure Evaluation (Addendum)
Anesthesia Evaluation  Patient identified by MRN, date of birth, ID band Patient awake    Reviewed: Allergy & Precautions, H&P , NPO status , Patient's Chart, lab work & pertinent test results  Airway Mallampati: I TM Distance: >3 FB Neck ROM: Full    Dental   Pulmonary former smoker,          Cardiovascular     Neuro/Psych PSYCHIATRIC DISORDERS Anxiety  Neuromuscular disease    GI/Hepatic   Endo/Other    Renal/GU      Musculoskeletal   Abdominal   Peds  Hematology   Anesthesia Other Findings   Reproductive/Obstetrics                          Anesthesia Physical Anesthesia Plan  ASA: II  Anesthesia Plan: MAC   Post-op Pain Management:    Induction: Intravenous  Airway Management Planned: Simple Face Mask  Additional Equipment:   Intra-op Plan:   Post-operative Plan:   Informed Consent:   Plan Discussed with: CRNA and Surgeon  Anesthesia Plan Comments:         Anesthesia Quick Evaluation

## 2013-04-30 NOTE — Transfer of Care (Signed)
Immediate Anesthesia Transfer of Care Note  Patient: William Swanson.  Procedure(s) Performed: Procedure(s): HERNIA REPAIR INGUINAL ADULT/MESH (Right) INSERTION OF MESH (Right)  Patient Location: PACU  Anesthesia Type:MAC  Level of Consciousness: awake and patient cooperative  Airway & Oxygen Therapy: Patient Spontanous Breathing and Patient connected to face mask oxygen  Post-op Assessment: Report given to PACU RN and Post -op Vital signs reviewed and stable  Post vital signs: Reviewed and stable  Complications: No apparent anesthesia complications

## 2013-04-30 NOTE — Anesthesia Procedure Notes (Signed)
Procedure Name: MAC Date/Time: 04/30/2013 8:49 AM Performed by: Jevonte Clanton Pre-anesthesia Checklist: Patient identified, Emergency Drugs available, Suction available, Patient being monitored and Timeout performed Patient Re-evaluated:Patient Re-evaluated prior to inductionOxygen Delivery Method: Simple face mask

## 2013-05-03 ENCOUNTER — Encounter (HOSPITAL_BASED_OUTPATIENT_CLINIC_OR_DEPARTMENT_OTHER): Payer: Self-pay | Admitting: General Surgery

## 2013-05-04 ENCOUNTER — Telehealth (INDEPENDENT_AMBULATORY_CARE_PROVIDER_SITE_OTHER): Payer: Self-pay

## 2013-05-04 NOTE — Telephone Encounter (Signed)
Message copied by Gweneth Fritter on Tue May 04, 2013  5:10 PM ------      Message from: Darcey Nora      Created: Tue May 04, 2013 10:11 AM      Contact: [place],      Pt is going out of country and would like to be seen by jan 30th for po visit. Please call pt.      Sonya. ------

## 2013-05-04 NOTE — Telephone Encounter (Signed)
I called pt and he is doing well.  He is moving his bowels.  His only concern is that his incision feels a little hard and raised.  I explained that this is probably the sutures under the skin and he may feel that firmness for a while before they dissolve.  It will eventually smooth out.  I will call him back with a po appointment.

## 2013-05-18 ENCOUNTER — Ambulatory Visit (INDEPENDENT_AMBULATORY_CARE_PROVIDER_SITE_OTHER): Payer: Medicare Other | Admitting: General Surgery

## 2013-05-18 ENCOUNTER — Encounter (INDEPENDENT_AMBULATORY_CARE_PROVIDER_SITE_OTHER): Payer: Self-pay | Admitting: General Surgery

## 2013-05-18 VITALS — BP 120/64 | HR 60 | Temp 97.0°F | Resp 14 | Ht 69.0 in | Wt 183.2 lb

## 2013-05-18 DIAGNOSIS — K4090 Unilateral inguinal hernia, without obstruction or gangrene, not specified as recurrent: Secondary | ICD-10-CM

## 2013-05-18 NOTE — Progress Notes (Signed)
Patient ID: William Swanson., male   DOB: 1940/09/23, 73 y.o.   MRN: [id number] History: Status post open repair right inguinal hernia hernia with mesh on 04/30/2013. He is doing very well. He says he never took a pain pill. No GI or GU problems. There is a little thickening of the incision but not too much pain  Exam: Patient looks well. No distress. Abdomen soft. Right inguinal incision healing normally.   Minimal thickening but he does have a small healing ridge. Hernia repair is intact. Penis scrotum and testes normal  Assessment: Right inguinal hernia, recovering uneventfully 18 days postop open repair with mesh  Plan: Diet and activities as discussed. Return as needed   [facility]. Dalbert Batman, M.D., [facility] General and Minimally invasive Surgery Breast and Colorectal Surgery Office:   [phone] Pager:   [phone]

## 2013-05-18 NOTE — Patient Instructions (Signed)
You are doing well postop. The small amount of swelling will go away in a few months.  You may walk as much as you wish. You may use the elliptical but at low levels.  You may resume normal physical activities after one month from the date of surgery. Be sure to stretch well before and after exercise. Return to see me as needed.

## 2013-06-20 DEATH — deceased

## 2013-07-16 ENCOUNTER — Telehealth: Payer: Self-pay

## 2013-07-16 NOTE — Telephone Encounter (Signed)
Patient past away per [facility] in [place] [employer] °

## 2014-02-24 IMAGING — CT CT ABD-PELV W/ CM
2 of 5 series · 17 of 46 positions shown, 19 images · IV contrast (omnipaque)
Comparison: None.

CLINICAL DATA: Hernia repair.  Abdominal pain.

CT ABDOMEN AND PELVIS WITH CONTRAST
TECHNIQUE: Multidetector CT imaging of the abdomen and pelvis was
performed following the standard protocol during bolus
administration of intravenous contrast.
Contrast: 100mL OMNIPAQUE IOHEXOL 300 MG/ML  SOLN

[Series 2: abd/ pel 5mm · axial · 0.70mm/px · z∈[-424,-18]mm · 14 of 91 slices shown, 16 images]
[im 5/91  soft-tissue]
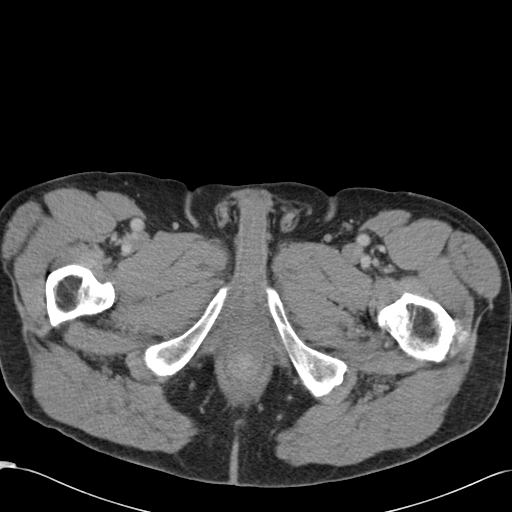
[im 5/91  bone]
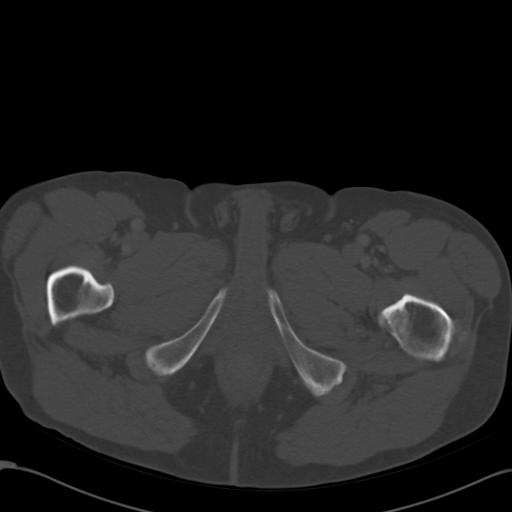
[im 10/91  soft-tissue]
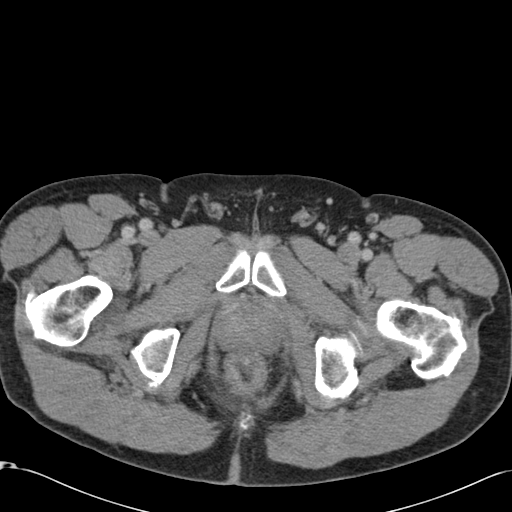
[im 19/91  soft-tissue]
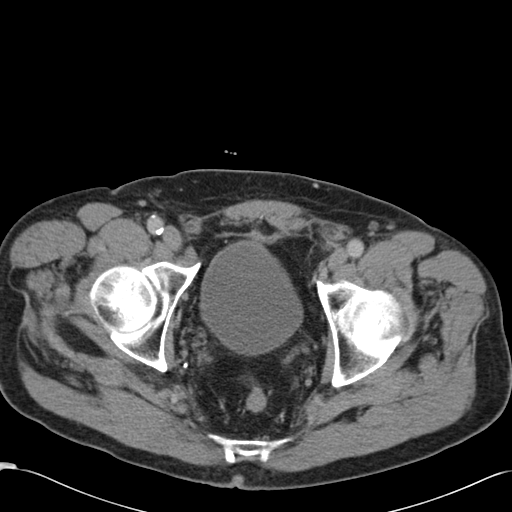
[im 24/91  soft-tissue]
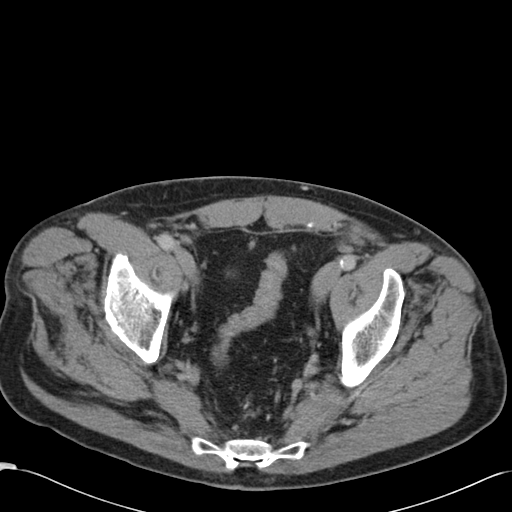
[im 29/91  soft-tissue]
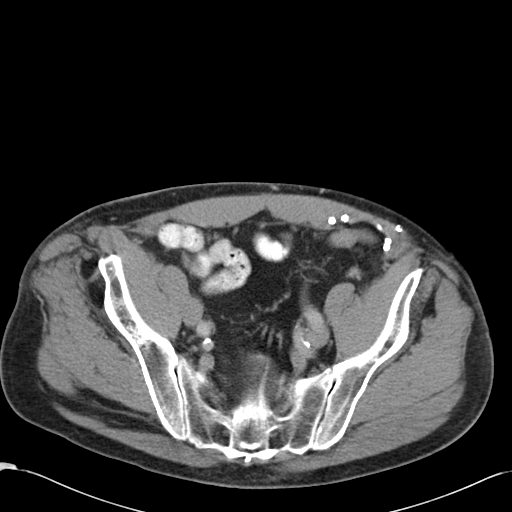
[im 38/91  soft-tissue]
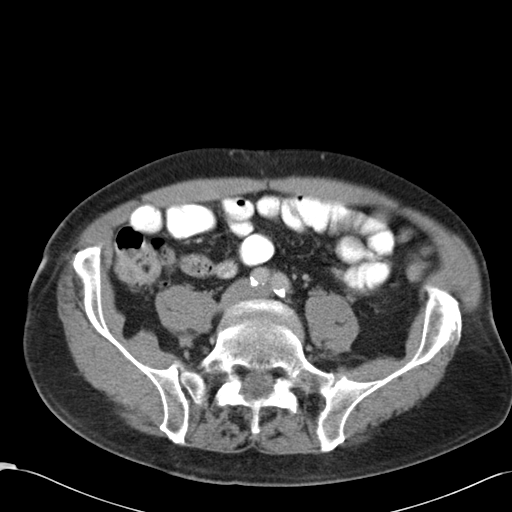
[im 43/91  soft-tissue]
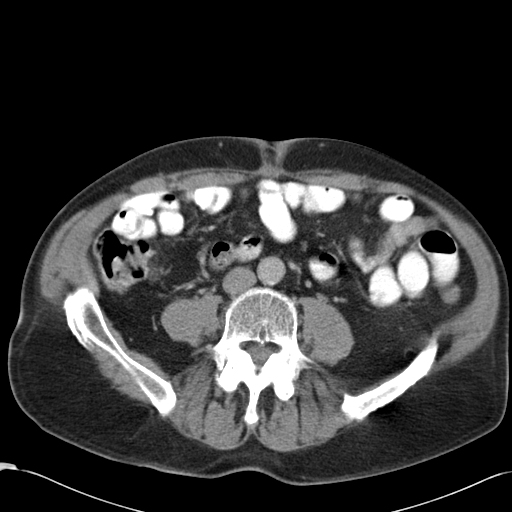
[im 48/91  soft-tissue]
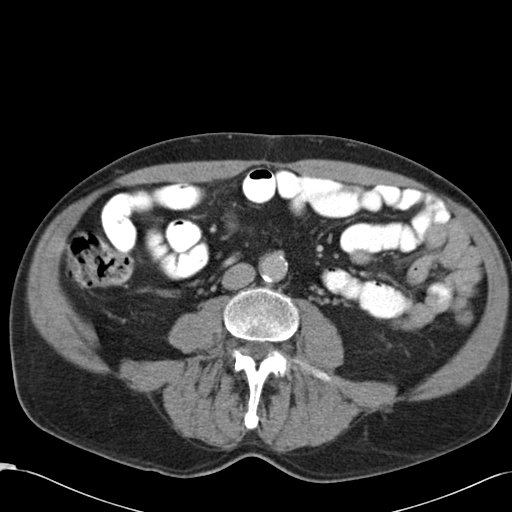
[im 53/91  soft-tissue]
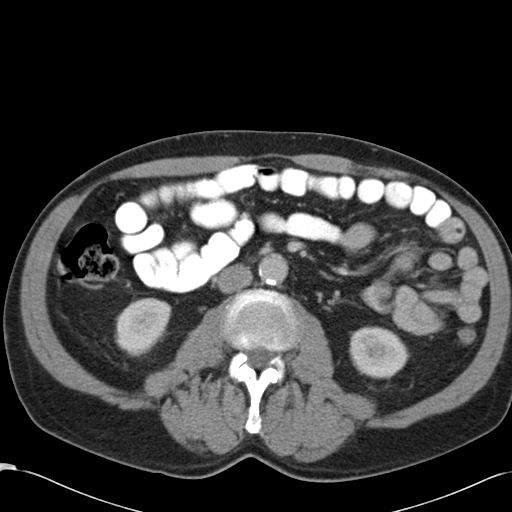
[im 53/91  bone]
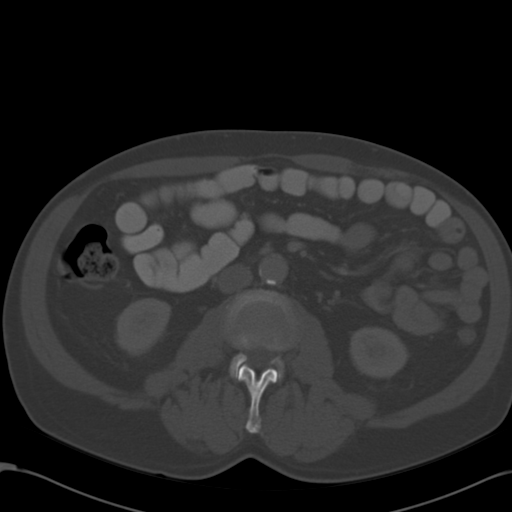
[im 62/91  soft-tissue]
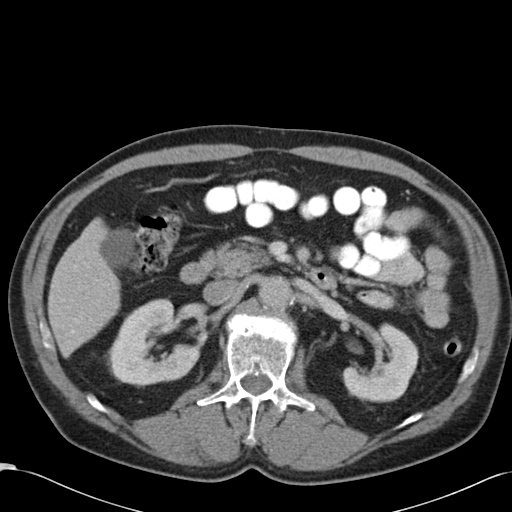
[im 67/91  soft-tissue]
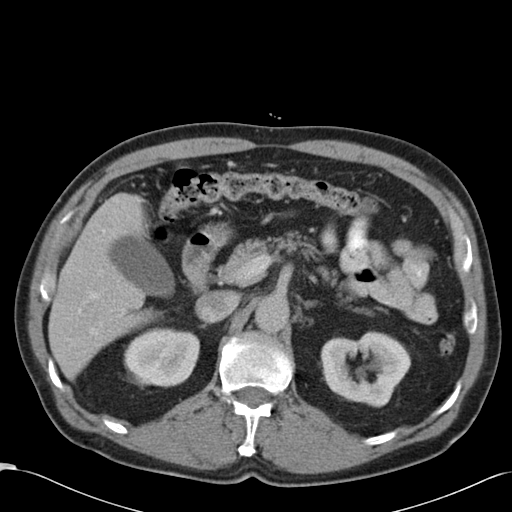
[im 72/91  soft-tissue]
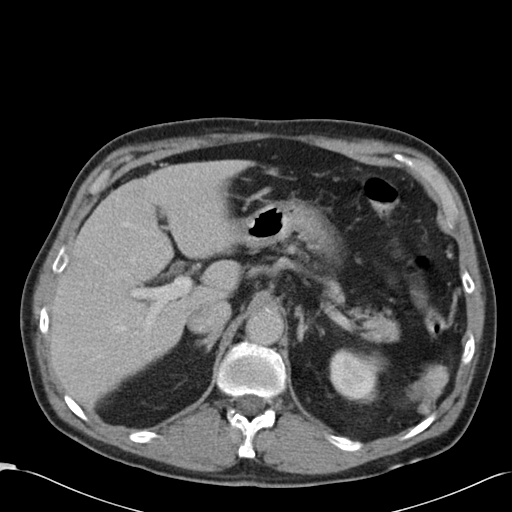
[im 81/91  soft-tissue]
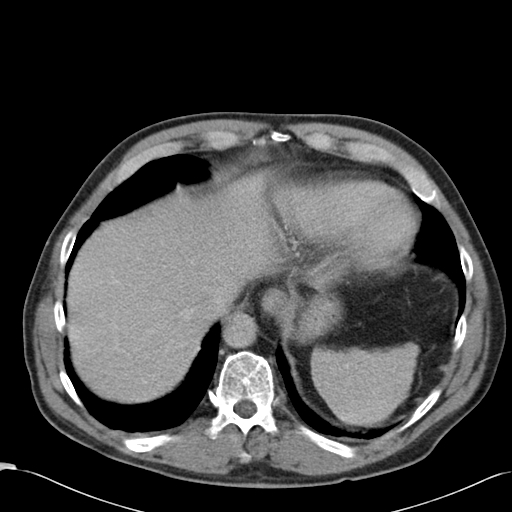
[im 86/91  soft-tissue]
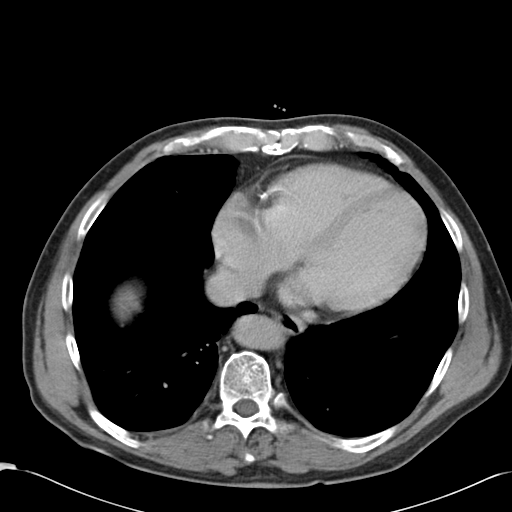

[Series 602: cor · coronal · 0.92mm/px · 3 of 112 slices shown]
[im 38/112  soft-tissue]
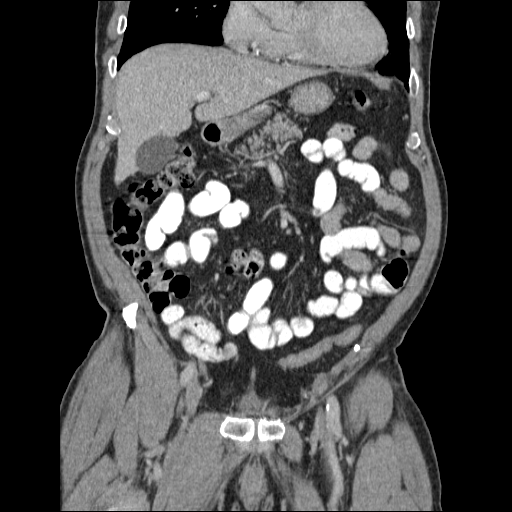
[im 50/112  soft-tissue]
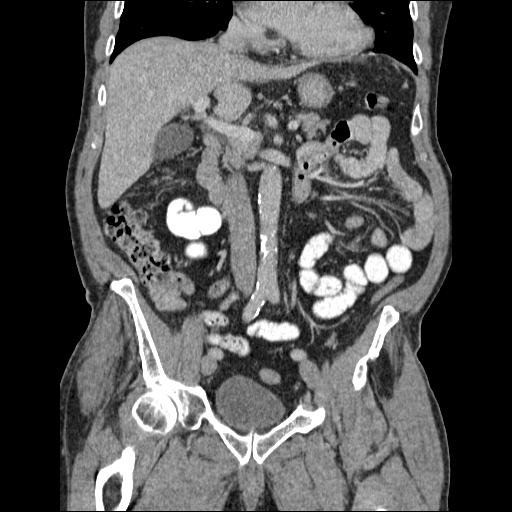
[im 62/112  soft-tissue]
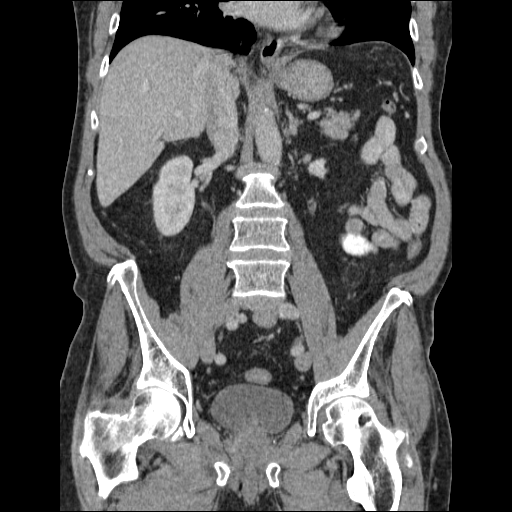

[17 of 46 positions shown; findings below may reference images not displayed]

FINDINGS: Lung bases are clear.  No focal hepatic lesion.
Gallbladder, pancreas, spleen, adrenal glands, and kidneys are
normal.

Stomach, small bowel, and appendix are normal.  The colon and
rectosigmoid colon are normal.

Abdominal aorta normal caliber.  No retroperitoneal periportal
lymphadenopathy.

No free fluid the pelvis.  The prostate gland is nodular at the
base  and indents the trigone region of the bladder.  Bladder cell
is normal.  No pelvic lymphadenopathy.

There is evidence of a ventral hernia repair along the left.
IMPRESSION: 1.  No abnormality of the bowel.
2.  Ventral hernia without  complication.
3.  Nodular enlargement of the prostate gland base.  Recommend
correlation with PSA.
